# Patient Record
Sex: Female | Born: 1960 | Race: Asian | Hispanic: Refuse to answer | Marital: Married | Smoking: Never smoker
Health system: Southern US, Community
[De-identification: ages and names within clinical notes are randomized; demographics above are authoritative.]

## PROBLEM LIST (undated history)

## (undated) DIAGNOSIS — I10 Essential (primary) hypertension: Secondary | ICD-10-CM

## (undated) HISTORY — DX: Essential (primary) hypertension: I10

## (undated) HISTORY — PX: TUBAL LIGATION: SHX77

---

## 2005-03-27 ENCOUNTER — Encounter: Admission: RE | Admit: 2005-03-27 | Discharge: 2005-03-27 | Payer: Self-pay | Admitting: Cardiology

## 2005-08-09 ENCOUNTER — Encounter: Admission: RE | Admit: 2005-08-09 | Discharge: 2005-08-09 | Payer: Self-pay | Admitting: Obstetrics and Gynecology

## 2005-08-28 ENCOUNTER — Encounter: Admission: RE | Admit: 2005-08-28 | Discharge: 2005-08-28 | Payer: Self-pay | Admitting: Obstetrics and Gynecology

## 2005-09-06 ENCOUNTER — Other Ambulatory Visit: Admission: RE | Admit: 2005-09-06 | Discharge: 2005-09-06 | Payer: Self-pay | Admitting: Obstetrics and Gynecology

## 2007-08-26 ENCOUNTER — Emergency Department (HOSPITAL_COMMUNITY): Admission: EM | Admit: 2007-08-26 | Discharge: 2007-08-26 | Payer: Self-pay | Admitting: Emergency Medicine

## 2011-03-27 LAB — URINALYSIS, ROUTINE W REFLEX MICROSCOPIC
Glucose, UA: NEGATIVE
Ketones, ur: NEGATIVE
Protein, ur: NEGATIVE
Urobilinogen, UA: 0.2

## 2014-09-22 ENCOUNTER — Other Ambulatory Visit: Payer: Self-pay

## 2014-09-22 DIAGNOSIS — Z1231 Encounter for screening mammogram for malignant neoplasm of breast: Secondary | ICD-10-CM

## 2014-09-25 ENCOUNTER — Ambulatory Visit
Admission: RE | Admit: 2014-09-25 | Discharge: 2014-09-25 | Disposition: A | Discharge status: Home / Self Care | Payer: Self-pay | Source: Ambulatory Visit

## 2014-09-25 DIAGNOSIS — Z1231 Encounter for screening mammogram for malignant neoplasm of breast: Secondary | ICD-10-CM

## 2016-06-30 ENCOUNTER — Other Ambulatory Visit: Payer: Self-pay | Admitting: Cardiology

## 2016-06-30 ENCOUNTER — Ambulatory Visit
Admission: RE | Admit: 2016-06-30 | Discharge: 2016-06-30 | Disposition: A | Discharge status: Home / Self Care | Payer: BLUE CROSS/BLUE SHIELD | Source: Ambulatory Visit | Attending: Cardiology | Admitting: Cardiology

## 2016-06-30 DIAGNOSIS — M25572 Pain in left ankle and joints of left foot: Secondary | ICD-10-CM

## 2019-10-10 ENCOUNTER — Other Ambulatory Visit: Payer: Self-pay

## 2019-10-13 ENCOUNTER — Other Ambulatory Visit (HOSPITAL_COMMUNITY)
Admission: RE | Admit: 2019-10-13 | Discharge: 2019-10-13 | Disposition: A | Discharge status: Home / Self Care | Payer: BC Managed Care – PPO | Source: Ambulatory Visit | Attending: Obstetrics & Gynecology | Admitting: Obstetrics & Gynecology

## 2019-10-13 ENCOUNTER — Other Ambulatory Visit: Payer: Self-pay

## 2019-10-13 ENCOUNTER — Ambulatory Visit (INDEPENDENT_AMBULATORY_CARE_PROVIDER_SITE_OTHER): Payer: BC Managed Care – PPO | Admitting: Obstetrics & Gynecology

## 2019-10-13 ENCOUNTER — Encounter: Payer: Self-pay | Admitting: Obstetrics & Gynecology

## 2019-10-13 VITALS — BP 134/78 | HR 70 | Temp 97.5°F | Resp 16 | Ht 62.25 in | Wt 173.0 lb

## 2019-10-13 DIAGNOSIS — Z205 Contact with and (suspected) exposure to viral hepatitis: Secondary | ICD-10-CM

## 2019-10-13 DIAGNOSIS — E2839 Other primary ovarian failure: Secondary | ICD-10-CM

## 2019-10-13 DIAGNOSIS — Z23 Encounter for immunization: Secondary | ICD-10-CM

## 2019-10-13 DIAGNOSIS — Z124 Encounter for screening for malignant neoplasm of cervix: Secondary | ICD-10-CM

## 2019-10-13 DIAGNOSIS — I10 Essential (primary) hypertension: Secondary | ICD-10-CM

## 2019-10-13 DIAGNOSIS — Z01419 Encounter for gynecological examination (general) (routine) without abnormal findings: Secondary | ICD-10-CM

## 2019-10-13 DIAGNOSIS — Z0000 Encounter for general adult medical examination without abnormal findings: Secondary | ICD-10-CM

## 2019-10-13 NOTE — Progress Notes (Signed)
59 y.o. [id number] Married [place] female here for annual exam.  Had lapse of care from gyn standpoint.  She is friends with Dr. Collene Mares.  She has her first grandchild due in May.    Denies vaginal bleeding.  Never on HRT.    No LMP recorded. Patient is postmenopausal.          Sexually active: Yes.    The current method of family planning is tubal ligation.    Exercising: Yes.    walking Smoker:  no  Health Maintenance: Pap:  24 yrs ago History of abnormal Pap:  no MMG:  09-25-14 category b density birads 1:neg Colonoscopy: no, declines BMD:   none TDaP:  2005  Pneumonia vaccine(s):  Not done Shingrix:   D/w pt.  She is planning on this in the fall Hep C testing: obtained today Screening Labs: obtained today   reports that she has never smoked. She has never used smokeless tobacco. She reports previous alcohol use. She reports that she does not use drugs.  Past Medical History:  Diagnosis Date  . Hypertension     Past Surgical History:  Procedure Laterality Date  . CESAREAN SECTION     times 3  . TUBAL LIGATION      Current Outpatient Medications  Medication Sig Dispense Refill  . Cholecalciferol (VITAMIN D3) 1.25 MG (50000 UT) CAPS once a week.    Marland Kitchen LOSARTAN POTASSIUM PO      No current facility-administered medications for this visit.    Family History  Problem Relation Age of Onset  . Hypertension Mother   . Hypertension Father   . Thyroid disease Sister   . Leukemia Nephew     Review of Systems  Constitutional: Negative.   HENT: Negative.   Eyes: Negative.   Respiratory: Negative.   Cardiovascular: Negative.   Gastrointestinal: Negative.   Endocrine: Negative.   Genitourinary: Negative.   Musculoskeletal: Negative.   Skin: Negative.   Allergic/Immunologic: Negative.   Neurological: Negative.   Psychiatric/Behavioral: Negative.     Exam:   BP 134/78   Pulse 70   Temp (!) 97.5 F (36.4 C) (Skin)   Resp 16   Ht 5' 2.25" (1.581 m)   Wt 173 lb (78.5  kg)   BMI 31.39 kg/m    Height: 5' 2.25" (158.1 cm)  Ht Readings from Last 3 Encounters:  10/13/19 5' 2.25" (1.581 m)    General appearance: alert, cooperative and appears stated age Head: Normocephalic, without obvious abnormality, atraumatic Neck: no adenopathy, supple, symmetrical, trachea midline and thyroid normal to inspection and palpation Lungs: clear to auscultation bilaterally Breasts: normal appearance, no masses or tenderness Heart: regular rate and rhythm Abdomen: soft, non-tender; bowel sounds normal; no masses,  no organomegaly Extremities: extremities normal, atraumatic, no cyanosis or edema Skin: Skin color, texture, turgor normal. No rashes or lesions Lymph nodes: Cervical, supraclavicular, and axillary nodes normal. No abnormal inguinal nodes palpated Neurologic: Grossly normal   Pelvic: External genitalia:  no lesions              Urethra:  normal appearing urethra with no masses, tenderness or lesions              Bartholins and Skenes: normal                 Vagina: normal appearing vagina with normal color and discharge, no lesions              Cervix: no lesions  Pap taken: Yes.   Bimanual Exam:  Uterus:  normal size, contour, position, consistency, mobility, non-tender              Adnexa: normal adnexa and no mass, fullness, tenderness               Rectovaginal: Confirms               Anus:  normal sphincter tone, no lesions  Chaperone, Christean Grief, CMA, was present for exam.  A:  Well Woman with normal exam PMP, no HRT Lapse of gyn care for >20 years Hypertension  P:   Mammogram guidelines reviewed.  Pt will schedule. pap smear and HR HPV obtained today Hep C antibody HbA1C, CBC, CMP, lipids, TSH and Vit D obtained today Order for BMD placed today Tdap will be updated today D/w pt Shingrix vaccination today.  She is going to wait unitl the fall for this. return annually or prn

## 2019-10-13 NOTE — Patient Instructions (Signed)
[facility] appt:  [phone]

## 2019-10-14 LAB — COMPREHENSIVE METABOLIC PANEL
ALT: 18 IU/L (ref 0–32)
AST: 23 IU/L (ref 0–40)
Albumin/Globulin Ratio: 1.6 (ref 1.2–2.2)
Albumin: 4.5 g/dL (ref 3.8–4.9)
Alkaline Phosphatase: 80 IU/L (ref 39–117)
BUN/Creatinine Ratio: 13 (ref 12–28)
BUN: 9 mg/dL (ref 8–27)
Bilirubin Total: 0.3 mg/dL (ref 0.0–1.2)
CO2: 24 mmol/L (ref 20–29)
Calcium: 9.4 mg/dL (ref 8.7–10.3)
Chloride: 102 mmol/L (ref 96–106)
Creatinine, Ser: 0.68 mg/dL (ref 0.57–1.00)
GFR calc Af Amer: 110 mL/min/{1.73_m2} (ref >59)
GFR calc non Af Amer: 95 mL/min/{1.73_m2} (ref >59)
Globulin, Total: 2.8 g/dL (ref 1.5–4.5)
Glucose: 90 mg/dL (ref 65–99)
Potassium: 4.4 mmol/L (ref 3.5–5.2)
Sodium: 142 mmol/L (ref 134–144)
Total Protein: 7.3 g/dL (ref 6.0–8.5)

## 2019-10-14 LAB — HEPATITIS C ANTIBODY: Hep C Virus Ab: <0.1 s/co ratio (ref 0.0–0.9)

## 2019-10-14 LAB — VITAMIN D 25 HYDROXY (VIT D DEFICIENCY, FRACTURES): Vit D, 25-Hydroxy: 41.6 ng/mL (ref 30.0–100.0)

## 2019-10-14 LAB — CYTOLOGY - PAP
Adequacy: ABSENT
Comment: NEGATIVE
Diagnosis: NEGATIVE
High risk HPV: NEGATIVE

## 2019-10-14 LAB — CBC
Hematocrit: 40.6 % (ref 34.0–46.6)
Hemoglobin: 13.4 g/dL (ref 11.1–15.9)
MCH: 31.5 pg (ref 26.6–33.0)
MCHC: 33 g/dL (ref 31.5–35.7)
MCV: 96 fL (ref 79–97)
Platelets: 261 10*3/uL (ref 150–450)
RBC: 4.25 x10E6/uL (ref 3.77–5.28)
RDW: 13.3 % (ref 11.7–15.4)
WBC: 6.4 10*3/uL (ref 3.4–10.8)

## 2019-10-14 LAB — TSH: TSH: 5.94 u[IU]/mL — ABNORMAL HIGH (ref 0.450–4.500)

## 2019-10-14 LAB — LIPID PANEL
Chol/HDL Ratio: 3.6 ratio (ref 0.0–4.4)
Cholesterol, Total: 190 mg/dL (ref 100–199)
HDL: 53 mg/dL (ref >39)
LDL Chol Calc (NIH): 114 mg/dL — ABNORMAL HIGH (ref 0–99)
Triglycerides: 132 mg/dL (ref 0–149)
VLDL Cholesterol Cal: 23 mg/dL (ref 5–40)

## 2019-10-14 LAB — HEMOGLOBIN A1C
Est. average glucose Bld gHb Est-mCnc: 128 mg/dL
Hgb A1c MFr Bld: 6.1 % — ABNORMAL HIGH (ref 4.8–5.6)

## 2019-10-17 LAB — T4, FREE: Free T4: 1.02 ng/dL (ref 0.82–1.77)

## 2019-10-17 LAB — SPECIMEN STATUS REPORT

## 2019-10-21 ENCOUNTER — Telehealth: Payer: Self-pay

## 2019-10-21 NOTE — Telephone Encounter (Signed)
-----   Message from Jerene Bears, MD sent at 10/19/2019  5:44 AM EDT ----- I called pt with all results of lab work.  I did add on a free T4.  She has seen these results as well.  It was normal.  We discussed rechecking a TSH, free T4 and possibly a HbA1C in 3 months.  Please see if she wants to schedule these.  She is going to establish care with PCP as well.  Does not need help with this.  I have not placed any future orders at this time.  Thanks.

## 2019-10-21 NOTE — Telephone Encounter (Signed)
Left message to call Jaymeson Mengel at [phone]. 

## 2019-10-22 ENCOUNTER — Other Ambulatory Visit: Payer: Self-pay | Admitting: Obstetrics & Gynecology

## 2019-10-22 DIAGNOSIS — E2839 Other primary ovarian failure: Secondary | ICD-10-CM

## 2019-10-22 DIAGNOSIS — Z1231 Encounter for screening mammogram for malignant neoplasm of breast: Secondary | ICD-10-CM

## 2019-10-23 ENCOUNTER — Ambulatory Visit
Admission: RE | Admit: 2019-10-23 | Discharge: 2019-10-23 | Disposition: A | Discharge status: Home / Self Care | Payer: BC Managed Care – PPO | Source: Ambulatory Visit | Attending: Obstetrics & Gynecology | Admitting: Obstetrics & Gynecology

## 2019-10-23 ENCOUNTER — Other Ambulatory Visit: Payer: Self-pay | Admitting: Obstetrics & Gynecology

## 2019-10-23 ENCOUNTER — Other Ambulatory Visit: Payer: Self-pay

## 2019-10-23 DIAGNOSIS — R7989 Other specified abnormal findings of blood chemistry: Secondary | ICD-10-CM

## 2019-10-23 DIAGNOSIS — Z1231 Encounter for screening mammogram for malignant neoplasm of breast: Secondary | ICD-10-CM | POA: Diagnosis not present

## 2019-10-23 DIAGNOSIS — R7303 Prediabetes: Secondary | ICD-10-CM

## 2019-10-23 NOTE — Telephone Encounter (Signed)
Patient notified of results. See lab. Lab appt scheduled for .

## 2020-01-16 ENCOUNTER — Other Ambulatory Visit: Payer: BC Managed Care – PPO

## 2020-01-16 ENCOUNTER — Other Ambulatory Visit (INDEPENDENT_AMBULATORY_CARE_PROVIDER_SITE_OTHER): Payer: BC Managed Care – PPO

## 2020-01-16 ENCOUNTER — Other Ambulatory Visit: Payer: Self-pay

## 2020-01-16 ENCOUNTER — Ambulatory Visit: Admission: RE | Admit: 2020-01-16 | Payer: BC Managed Care – PPO | Source: Ambulatory Visit

## 2020-01-16 DIAGNOSIS — R7989 Other specified abnormal findings of blood chemistry: Secondary | ICD-10-CM | POA: Diagnosis not present

## 2020-01-16 DIAGNOSIS — R7303 Prediabetes: Secondary | ICD-10-CM

## 2020-01-17 LAB — TSH: TSH: 7.23 u[IU]/mL — ABNORMAL HIGH (ref 0.450–4.500)

## 2020-01-17 LAB — T4, FREE: Free T4: 1.11 ng/dL (ref 0.82–1.77)

## 2020-01-17 LAB — HEMOGLOBIN A1C
Est. average glucose Bld gHb Est-mCnc: 126 mg/dL
Hgb A1c MFr Bld: 6 % — ABNORMAL HIGH (ref 4.8–5.6)

## 2020-01-23 ENCOUNTER — Other Ambulatory Visit: Payer: Self-pay | Admitting: Obstetrics & Gynecology

## 2020-01-23 DIAGNOSIS — E2839 Other primary ovarian failure: Secondary | ICD-10-CM

## 2020-01-26 ENCOUNTER — Other Ambulatory Visit: Payer: BC Managed Care – PPO

## 2020-02-10 ENCOUNTER — Telehealth: Payer: Self-pay

## 2020-02-10 DIAGNOSIS — R7989 Other specified abnormal findings of blood chemistry: Secondary | ICD-10-CM

## 2020-02-10 NOTE — Telephone Encounter (Signed)
Pt called back to say she wanted to be referred to Dr Talmage Nap. Pt advised referral placed. Pt thankful for referral.  Addendum closed.  Cc: Rosa for update

## 2020-02-10 NOTE — Addendum Note (Signed)
Addended by: Isabell Jarvis on: 02/10/2020 02:14 PM   Modules accepted: Orders

## 2020-02-10 NOTE — Telephone Encounter (Signed)
Spoke with pt. Pt agreeable to referral to Dr Elvera Lennox. Referral placed. Pt aware of call from [facility] for referral.   Please call on pt's mobile number only.  Cc: Rosa Routing to Dr Hyacinth Meeker for update  Encounter closed.    Leda Min, RN  01/22/2020 3:22 PM EDT Back to Top    Spoke with patient, advised as seen below per Dr. Hyacinth Meeker. Patient would like to discuss with her spouse, she will return call to the office to advise where she would like endocrinology referral to be placed. Patient verbalizes understanding and is agreeable.   Routing to Dr. Gloris Ham.    Jerene Bears, MD  01/22/2020 7:46 AM EDT     Please call pt. Her TSH was a little higher and her free T4 was still normal. This is subclinical hypothyroidism. It is sometimes treated. Also, she still has prediabetes. I would recommend an endocrinology consultation with Dr. Elvera Lennox to discuss both. Ok to proceed if pt is comfortable with this. Thanks.

## 2020-02-16 ENCOUNTER — Ambulatory Visit
Admission: RE | Admit: 2020-02-16 | Discharge: 2020-02-16 | Disposition: A | Discharge status: Home / Self Care | Payer: BC Managed Care – PPO | Source: Ambulatory Visit | Attending: Obstetrics & Gynecology | Admitting: Obstetrics & Gynecology

## 2020-02-16 ENCOUNTER — Other Ambulatory Visit: Payer: Self-pay

## 2020-02-16 DIAGNOSIS — E2839 Other primary ovarian failure: Secondary | ICD-10-CM

## 2020-02-16 DIAGNOSIS — Z780 Asymptomatic menopausal state: Secondary | ICD-10-CM | POA: Diagnosis not present

## 2020-02-16 DIAGNOSIS — M85852 Other specified disorders of bone density and structure, left thigh: Secondary | ICD-10-CM | POA: Diagnosis not present

## 2020-03-22 DIAGNOSIS — R7301 Impaired fasting glucose: Secondary | ICD-10-CM | POA: Diagnosis not present

## 2020-03-22 DIAGNOSIS — I10 Essential (primary) hypertension: Secondary | ICD-10-CM | POA: Diagnosis not present

## 2020-03-22 DIAGNOSIS — E039 Hypothyroidism, unspecified: Secondary | ICD-10-CM | POA: Diagnosis not present

## 2020-05-03 DIAGNOSIS — R7301 Impaired fasting glucose: Secondary | ICD-10-CM | POA: Diagnosis not present

## 2020-05-03 DIAGNOSIS — E039 Hypothyroidism, unspecified: Secondary | ICD-10-CM | POA: Diagnosis not present

## 2020-11-26 ENCOUNTER — Other Ambulatory Visit: Payer: Self-pay

## 2020-11-26 ENCOUNTER — Emergency Department (HOSPITAL_COMMUNITY): Payer: BC Managed Care – PPO

## 2020-11-26 ENCOUNTER — Emergency Department (HOSPITAL_COMMUNITY)
Admission: EM | Admit: 2020-11-26 | Discharge: 2020-11-26 | Disposition: A | Discharge status: Home / Self Care | Payer: BC Managed Care – PPO | Attending: Emergency Medicine | Admitting: Emergency Medicine

## 2020-11-26 DIAGNOSIS — N200 Calculus of kidney: Secondary | ICD-10-CM | POA: Diagnosis not present

## 2020-11-26 DIAGNOSIS — Z79899 Other long term (current) drug therapy: Secondary | ICD-10-CM | POA: Insufficient documentation

## 2020-11-26 DIAGNOSIS — I700 Atherosclerosis of aorta: Secondary | ICD-10-CM | POA: Diagnosis not present

## 2020-11-26 DIAGNOSIS — K429 Umbilical hernia without obstruction or gangrene: Secondary | ICD-10-CM | POA: Diagnosis not present

## 2020-11-26 DIAGNOSIS — R188 Other ascites: Secondary | ICD-10-CM | POA: Diagnosis not present

## 2020-11-26 DIAGNOSIS — I10 Essential (primary) hypertension: Secondary | ICD-10-CM | POA: Diagnosis not present

## 2020-11-26 DIAGNOSIS — N132 Hydronephrosis with renal and ureteral calculous obstruction: Secondary | ICD-10-CM | POA: Diagnosis not present

## 2020-11-26 DIAGNOSIS — R109 Unspecified abdominal pain: Secondary | ICD-10-CM | POA: Diagnosis not present

## 2020-11-26 LAB — CBC WITH DIFFERENTIAL/PLATELET
Abs Immature Granulocytes: 0.04 10*3/uL (ref 0.00–0.07)
Basophils Absolute: 0.1 10*3/uL (ref 0.0–0.1)
Basophils Relative: 1 %
Eosinophils Absolute: 0.2 10*3/uL (ref 0.0–0.5)
Eosinophils Relative: 1 %
HCT: 43 % (ref 36.0–46.0)
Hemoglobin: 14.3 g/dL (ref 12.0–15.0)
Immature Granulocytes: 0 %
Lymphocytes Relative: 19 %
Lymphs Abs: 2.2 10*3/uL (ref 0.7–4.0)
MCH: 32.6 pg (ref 26.0–34.0)
MCHC: 33.3 g/dL (ref 30.0–36.0)
MCV: 97.9 fL (ref 80.0–100.0)
Monocytes Absolute: 0.5 10*3/uL (ref 0.1–1.0)
Monocytes Relative: 5 %
Neutro Abs: 8.8 10*3/uL — ABNORMAL HIGH (ref 1.7–7.7)
Neutrophils Relative %: 74 %
Platelets: 264 10*3/uL (ref 150–400)
RBC: 4.39 MIL/uL (ref 3.87–5.11)
RDW: 13.2 % (ref 11.5–15.5)
WBC: 11.8 10*3/uL — ABNORMAL HIGH (ref 4.0–10.5)
nRBC: 0 % (ref 0.0–0.2)

## 2020-11-26 LAB — URINALYSIS, ROUTINE W REFLEX MICROSCOPIC
Bilirubin Urine: NEGATIVE
Glucose, UA: NEGATIVE mg/dL
Hgb urine dipstick: NEGATIVE
Ketones, ur: NEGATIVE mg/dL
Leukocytes,Ua: NEGATIVE
Nitrite: NEGATIVE
Protein, ur: NEGATIVE mg/dL
Specific Gravity, Urine: 1.006 (ref 1.005–1.030)
pH: 8 (ref 5.0–8.0)

## 2020-11-26 LAB — COMPREHENSIVE METABOLIC PANEL
ALT: 19 U/L (ref 0–44)
AST: 21 U/L (ref 15–41)
Albumin: 4.9 g/dL (ref 3.5–5.0)
Alkaline Phosphatase: 78 U/L (ref 38–126)
Anion gap: 10 (ref 5–15)
BUN: 12 mg/dL (ref 8–23)
CO2: 28 mmol/L (ref 22–32)
Calcium: 9.8 mg/dL (ref 8.9–10.3)
Chloride: 103 mmol/L (ref 98–111)
Creatinine, Ser: 0.97 mg/dL (ref 0.44–1.00)
GFR, Estimated: >60 mL/min (ref >60)
Glucose, Bld: 119 mg/dL — ABNORMAL HIGH (ref 70–99)
Potassium: 3.5 mmol/L (ref 3.5–5.1)
Sodium: 141 mmol/L (ref 135–145)
Total Bilirubin: 0.4 mg/dL (ref 0.3–1.2)
Total Protein: 9 g/dL — ABNORMAL HIGH (ref 6.5–8.1)

## 2020-11-26 MED ORDER — ONDANSETRON HCL 4 MG/2ML IJ SOLN
4.0000 mg | Freq: Once | INTRAMUSCULAR | Status: AC
  Administered 2020-11-26: 4 mg via INTRAVENOUS
  Filled 2020-11-26: qty 2

## 2020-11-26 MED ORDER — HYDROCODONE-ACETAMINOPHEN 5-325 MG PO TABS
1.0000 | ORAL_TABLET | Freq: Once | ORAL | Status: AC
  Administered 2020-11-26: 1 via ORAL
  Filled 2020-11-26: qty 1

## 2020-11-26 MED ORDER — HYDROCODONE-ACETAMINOPHEN 5-325 MG PO TABS: 1.0000 | ORAL_TABLET | Freq: Four times a day (QID) | ORAL | 0 refills | Status: DC | PRN

## 2020-11-26 MED ORDER — SODIUM CHLORIDE 0.9 % IV SOLN: INTRAVENOUS | Status: DC

## 2020-11-26 MED ORDER — KETOROLAC TROMETHAMINE 15 MG/ML IJ SOLN
15.0000 mg | Freq: Once | INTRAMUSCULAR | Status: AC
  Administered 2020-11-26: 15 mg via INTRAVENOUS
  Filled 2020-11-26: qty 1

## 2020-11-26 MED ORDER — MORPHINE SULFATE (PF) 4 MG/ML IV SOLN
4.0000 mg | Freq: Once | INTRAVENOUS | Status: AC
Start: 2020-11-26 — End: 2020-11-26
  Administered 2020-11-26: 4 mg via INTRAVENOUS
  Filled 2020-11-26: qty 1

## 2020-11-26 NOTE — ED Provider Notes (Signed)
[facility]-EMERGENCY DEPT Provider Note   CSN: [id number] Arrival date & time: 11/26/20  1510     History Chief Complaint  Patient presents with  . Flank Pain    Ashlee Oconnor is a 60 y.o. female.  The history is provided by the patient.  Flank Pain This is a new problem. The current episode started 3 to 5 hours ago. The problem occurs constantly. The problem has not changed since onset.Associated symptoms comments: Left flank pain that started a little bit last night but was resolved this morning.  2 to 3 hours ago the pain started suddenly and has been severe 10 out of 10.  There is not significant radiation of pain.  Nothing seems to make the pain any better.  1 episode of vomiting related to pain.  Bowel movements have been normal.  The pain in the left side gets a little bit worse with urinating but no dysuria, frequency or urgency.  No fever, cough, shortness of breath or chest pain. Last kidney stone was approximately 10 years ago.. Nothing aggravates the symptoms. Nothing relieves the symptoms. She has tried nothing for the symptoms. The treatment provided no relief.       Past Medical History:  Diagnosis Date  . Hypertension     Patient Active Problem List   Diagnosis Date Noted  . Hypertension 10/13/2019    Past Surgical History:  Procedure Laterality Date  . CESAREAN SECTION     times 3  . TUBAL LIGATION       OB History    Gravida  3   Para  3   Term  3   Preterm      AB      Living  3     SAB      IAB      Ectopic      Multiple      Live Births              Family History  Problem Relation Age of Onset  . Hypertension Mother   . Hypertension Father   . Thyroid disease Sister   . Leukemia Nephew     Social History   Tobacco Use  . Smoking status: Never Smoker  . Smokeless tobacco: Never Used  Substance Use Topics  . Alcohol use: Not Currently  . Drug use: Never    Home Medications Prior to  Admission medications   Medication Sig Start Date End Date Taking? Authorizing Provider  Cholecalciferol (VITAMIN D3) 1.25 MG (50000 UT) CAPS once a week. 10/10/19   [provider]  LOSARTAN POTASSIUM PO  08/24/19   [provider]    Allergies    Patient has no allergy information on record.  Review of Systems   Review of Systems  Genitourinary: Positive for flank pain.  All other systems reviewed and are negative.   Physical Exam Updated Vital Signs BP (!) 160/79 (BP Location: Left Arm)   Pulse 85   Temp 97.9 F (36.6 C) (Oral)   Resp 18   Ht 5\' 3"  (1.6 m)   Wt 76.2 kg   SpO2 100%   BMI 29.76 kg/m   Physical Exam Vitals and nursing note reviewed.  Constitutional:      General: She is not in acute distress.    Appearance: She is well-developed.     Comments: Appears uncomfortable  HENT:     Head: Normocephalic and atraumatic.     Mouth/Throat:  Mouth: Mucous membranes are moist.  Eyes:     Pupils: Pupils are equal, round, and reactive to light.  Cardiovascular:     Rate and Rhythm: Normal rate and regular rhythm.     Heart sounds: Normal heart sounds. No murmur heard. No friction rub.  Pulmonary:     Effort: Pulmonary effort is normal.     Breath sounds: Normal breath sounds. No wheezing or rales.  Abdominal:     General: Bowel sounds are normal. There is no distension.     Palpations: Abdomen is soft.     Tenderness: There is no abdominal tenderness. There is left CVA tenderness. There is no guarding or rebound.     Comments: Minimal left flank pain  Musculoskeletal:        General: No tenderness. Normal range of motion.     Right lower leg: No edema.     Left lower leg: No edema.     Comments: No edema  Skin:    General: Skin is warm and dry.     Findings: No rash.  Neurological:     Mental Status: She is alert and oriented to person, place, and time. Mental status is at baseline.     Cranial Nerves: No cranial nerve deficit.   Psychiatric:        Mood and Affect: Mood normal.        Behavior: Behavior normal.        Thought Content: Thought content normal.     ED Results / Procedures / Treatments   Labs (all labs ordered are listed, but only abnormal results are displayed) Labs Reviewed  CBC WITH DIFFERENTIAL/PLATELET - Abnormal; Notable for the following components:      Result Value   WBC 11.8 (*)    Neutro Abs 8.8 (*)    All other components within normal limits  COMPREHENSIVE METABOLIC PANEL - Abnormal; Notable for the following components:   Glucose, Bld 119 (*)    Total Protein 9.0 (*)    All other components within normal limits  URINALYSIS, ROUTINE W REFLEX MICROSCOPIC - Abnormal; Notable for the following components:   Color, Urine COLORLESS (*)    All other components within normal limits    EKG None  Radiology CT Renal Stone Study  Result Date: 11/26/2020 CLINICAL DATA:  60 year old with left flank pain. EXAM: CT ABDOMEN AND PELVIS WITHOUT CONTRAST TECHNIQUE: Multidetector CT imaging of the abdomen and pelvis was performed following the standard protocol without IV contrast. COMPARISON:  08/26/2007. FINDINGS: Lower chest: 3 mm nodular density along the left major fissure on sequence 6, image 5. Otherwise, the lung bases are clear. No pleural effusions. Hepatobiliary: Normal appearance of the liver and gallbladder. No biliary dilatation. Pancreas: Unremarkable. No pancreatic ductal dilatation or surrounding inflammatory changes. Spleen: Normal in size without focal abnormality. Adrenals/Urinary Tract: Normal adrenal glands. 3 mm stone in the right kidney lower pole without hydronephrosis. 2 mm stone near the left ureterovesical junction. Mild left hydroureteronephrosis with left perinephric edema. No additional stones within the left kidney. Small amount of fluid in the urinary bladder. Stomach/Bowel: Stomach is within normal limits. Appendix appears normal. No evidence of bowel wall thickening,  distention, or inflammatory changes. Vascular/Lymphatic: Atherosclerotic calcifications in the abdominal aorta without aneurysm. No lymph node enlargement in the abdomen or pelvis. Reproductive: Uterus and bilateral adnexa are unremarkable. Other: Negative for free fluid. Negative for free air. Tiny umbilical hernia containing fat. Musculoskeletal: No acute bone abnormality. IMPRESSION: 1. Mild left  hydroureteronephrosis due to a 2 mm stone at the left ureterovesical junction. Left perinephric edema. 2. Nonobstructive right renal calculus. 3. 3 mm nodule along the left major fissure. No follow-up needed if patient is low-risk. Non-contrast chest CT can be considered in 12 months if patient is high-risk. This recommendation follows the consensus statement: Guidelines for Management of Incidental Pulmonary Nodules Detected on CT Images: From the Fleischner Society 2017; Radiology 2017; 284:228-243. Electronically Signed   By: Richarda Overlie M.D.   On: 11/26/2020 17:22    Procedures Procedures   Medications Ordered in ED Medications  0.9 %  sodium chloride infusion (has no administration in time range)  ondansetron (ZOFRAN) injection 4 mg (has no administration in time range)  morphine 4 MG/ML injection 4 mg (has no administration in time range)    ED Course  I have reviewed the triage vital signs and the nursing notes.  Pertinent labs & imaging results that were available during my care of the patient were reviewed by me and considered in my medical decision making (see chart for details).    MDM Rules/Calculators/A&P                          Pt with symptoms consistent with kidney stone.  Denies infectious sx, or GI symptoms.  Low concern for diverticulitis and low suspicion for AAA.  No hx suggestive of GU source (discharge) and otherwise pt is healthy.  Will treat pain and ensure no infection with UA, CBC, CMP and will get stone study to further eval.  4:38 PM Pt still having pain.  CBC and CMP  wnl.  Will give toradol.  CT pending.  5:37 PM UA is within normal limits.  CT shows a 2 mm UVJ stone on the left with mild hydronephrosis.  On repeat evaluation patient reports the pain is better but still present.  She would like to go home.  She was given a Vicodin and given a prescription.  MDM Number of Diagnoses or Management Options   Amount and/or Complexity of Data Reviewed Clinical lab tests: ordered and reviewed Tests in the radiology section of CPT: ordered and reviewed Independent visualization of images, tracings, or specimens: yes     Final Clinical Impression(s) / ED Diagnoses Final diagnoses:  Kidney stone on left side    Rx / DC Orders ED Discharge Orders         Ordered    HYDROcodone-acetaminophen (NORCO/VICODIN) 5-325 MG tablet  Every 6 hours PRN        11/26/20 1740           Gwyneth Sprout, MD 11/26/20 1740

## 2020-11-26 NOTE — ED Notes (Signed)
Pt ambulated to bathroom with minimal assistance.

## 2020-11-26 NOTE — ED Triage Notes (Signed)
Pt came from work via POV. Pt states pain in left flank that feels like a kidney stone. Pt states pain is intermittently sever and began approximately 2 hours ago suddenly. Pt states her most recent kidney stone was 10 years ago.

## 2020-11-26 NOTE — Discharge Instructions (Signed)
You have a 61mm stone on the left side today and a 62mm stone in the right kidney.  Drink plenty of fluid.  Return if you have severe pain, vomiting or fever.

## 2020-12-28 IMAGING — MG DIGITAL SCREENING BILAT W/ TOMO W/ CAD
8 series · 8 of 24 positions shown · non-contrast
Comparison: Previous exam(s).

CLINICAL DATA: Screening.

EXAM:
DIGITAL SCREENING BILATERAL MAMMOGRAM WITH TOMO AND CAD

[R MLO synth-2D]
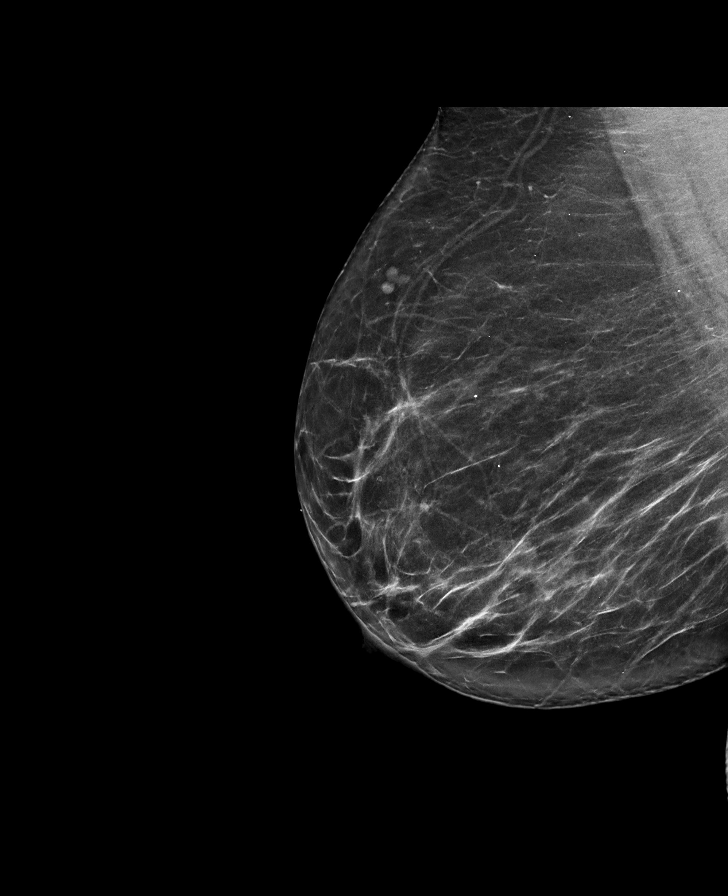

[L CC synth-2D]
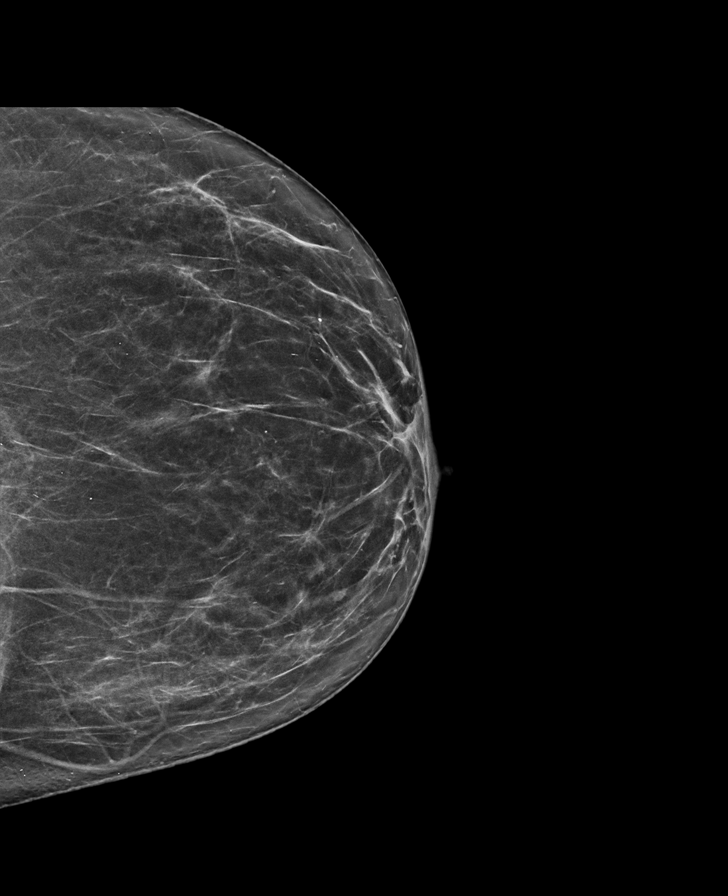

[R CC synth-2D]
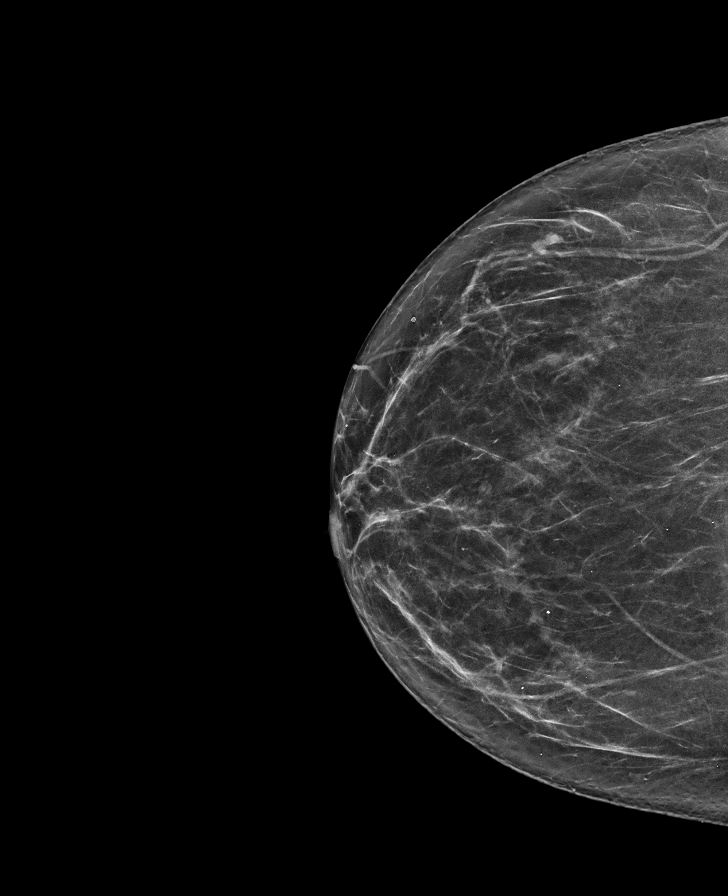

[L MLO synth-2D]
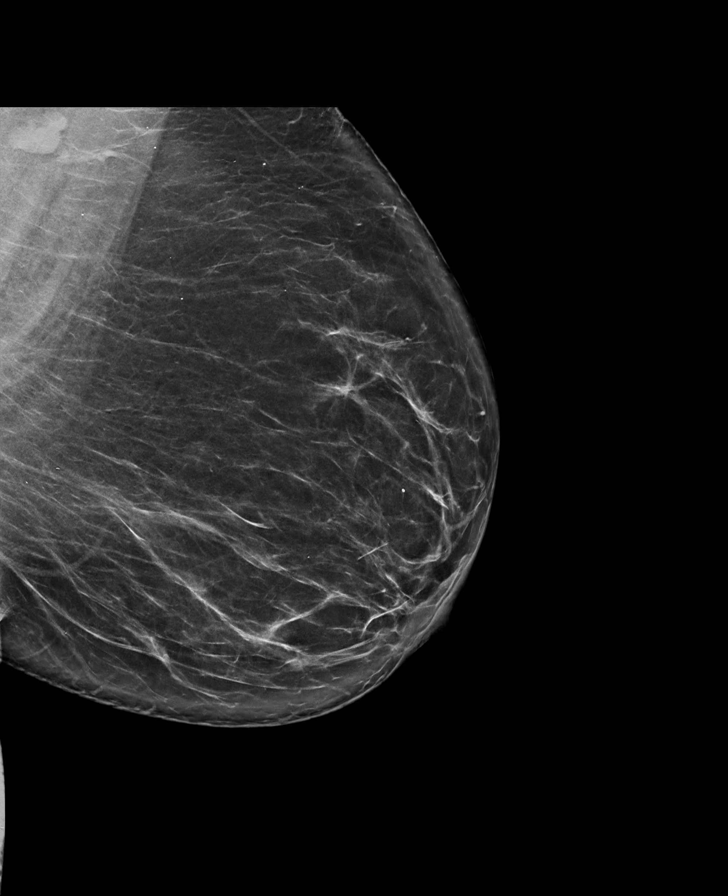

[L MLO tomo · tomo slice 45/88.0]
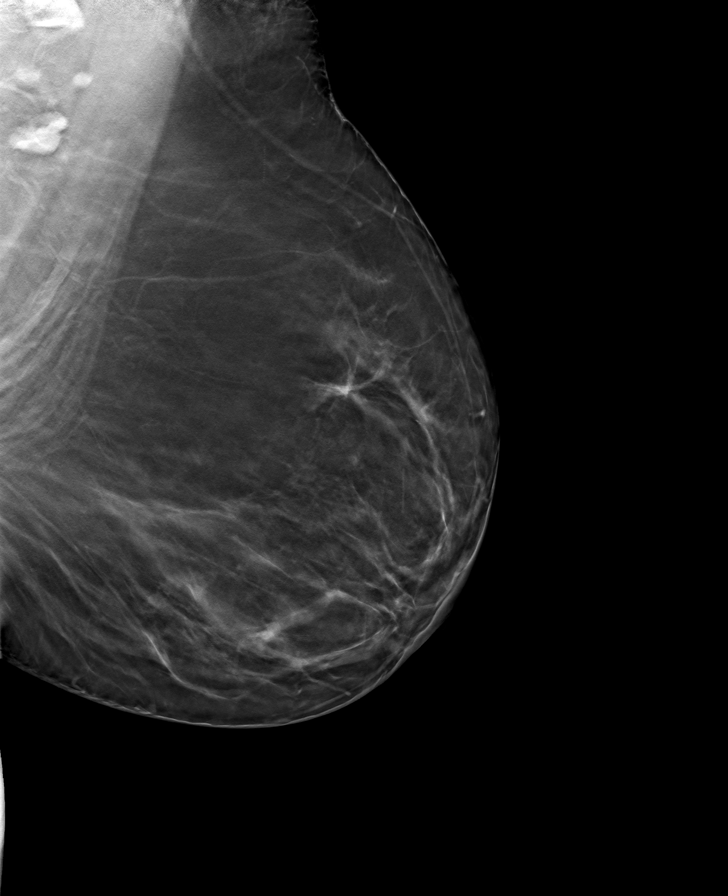

[L CC tomo · tomo slice 37/72.0]
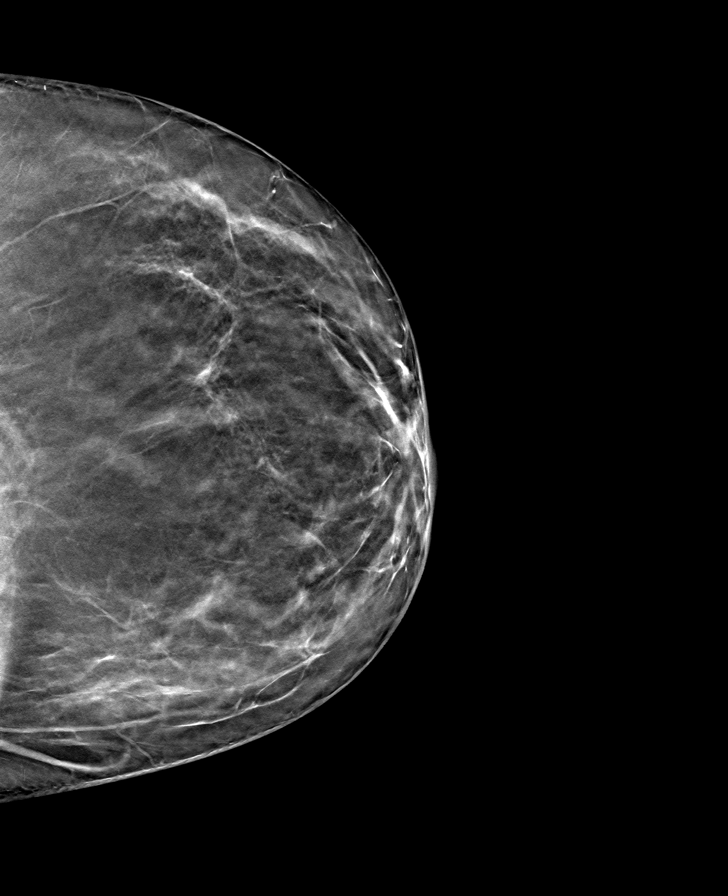

[R CC tomo · tomo slice 35/70.0]
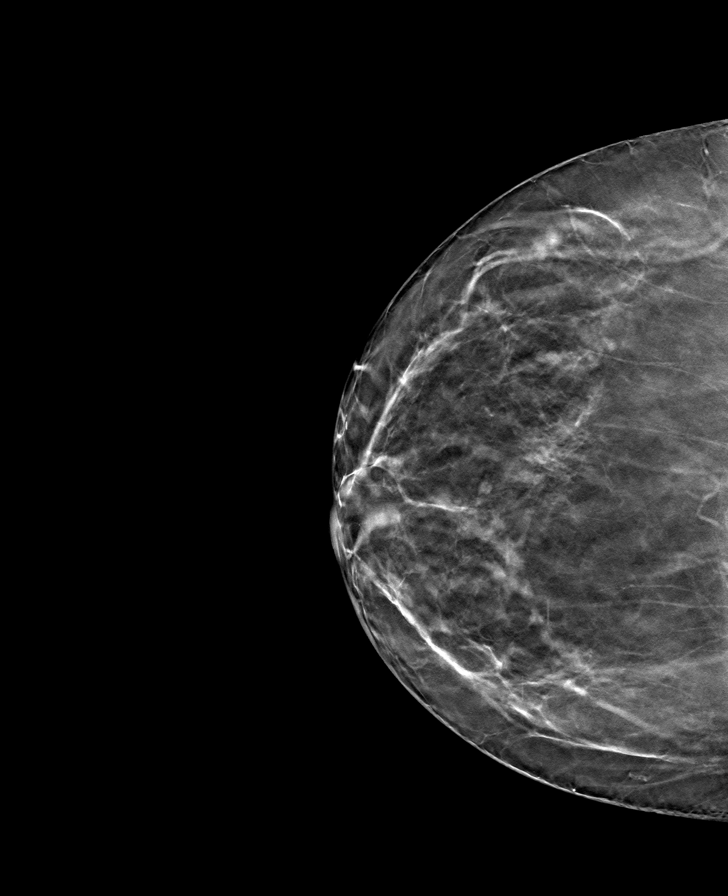

[R MLO tomo · tomo slice 41/81.0]
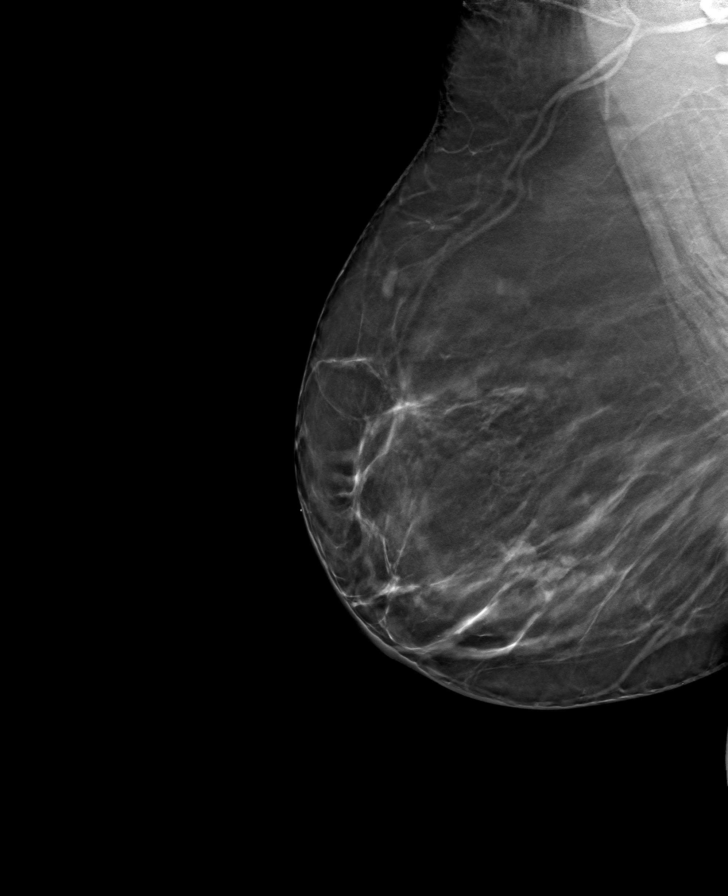

[8 of 24 positions shown; findings below may reference images not displayed]

ACR Breast Density Category b: There are scattered areas of
fibroglandular density.
FINDINGS: There are no findings suspicious for malignancy. Images were
processed with CAD.
IMPRESSION: No mammographic evidence of malignancy. A result letter of this
screening mammogram will be mailed directly to the patient.

RECOMMENDATION:
Screening mammogram in one year. (Code:[id number]-775)

BI-RADS CATEGORY  1: Negative.

## 2021-06-14 ENCOUNTER — Observation Stay (HOSPITAL_COMMUNITY)
Admission: EM | Admit: 2021-06-14 | Discharge: 2021-06-14 | Disposition: A | Discharge status: Home / Self Care | Payer: BC Managed Care – PPO | Attending: Interventional Cardiology | Admitting: Interventional Cardiology

## 2021-06-14 ENCOUNTER — Other Ambulatory Visit (HOSPITAL_COMMUNITY): Payer: BC Managed Care – PPO

## 2021-06-14 ENCOUNTER — Encounter (HOSPITAL_COMMUNITY): Payer: Self-pay | Admitting: Emergency Medicine

## 2021-06-14 ENCOUNTER — Emergency Department (HOSPITAL_COMMUNITY): Payer: BC Managed Care – PPO

## 2021-06-14 ENCOUNTER — Other Ambulatory Visit: Payer: Self-pay

## 2021-06-14 ENCOUNTER — Other Ambulatory Visit (HOSPITAL_COMMUNITY): Payer: Self-pay

## 2021-06-14 ENCOUNTER — Encounter (HOSPITAL_COMMUNITY)
Admission: EM | Disposition: A | Discharge status: Home / Self Care | Payer: Self-pay | Source: Home / Self Care | Attending: Emergency Medicine

## 2021-06-14 DIAGNOSIS — I200 Unstable angina: Secondary | ICD-10-CM | POA: Diagnosis present

## 2021-06-14 DIAGNOSIS — Z79899 Other long term (current) drug therapy: Secondary | ICD-10-CM | POA: Diagnosis not present

## 2021-06-14 DIAGNOSIS — I25110 Atherosclerotic heart disease of native coronary artery with unstable angina pectoris: Secondary | ICD-10-CM | POA: Diagnosis not present

## 2021-06-14 DIAGNOSIS — Z20822 Contact with and (suspected) exposure to covid-19: Secondary | ICD-10-CM | POA: Diagnosis not present

## 2021-06-14 DIAGNOSIS — I10 Essential (primary) hypertension: Secondary | ICD-10-CM | POA: Insufficient documentation

## 2021-06-14 DIAGNOSIS — E785 Hyperlipidemia, unspecified: Secondary | ICD-10-CM | POA: Diagnosis not present

## 2021-06-14 DIAGNOSIS — R079 Chest pain, unspecified: Secondary | ICD-10-CM

## 2021-06-14 DIAGNOSIS — Z955 Presence of coronary angioplasty implant and graft: Secondary | ICD-10-CM

## 2021-06-14 DIAGNOSIS — R0789 Other chest pain: Secondary | ICD-10-CM | POA: Diagnosis not present

## 2021-06-14 DIAGNOSIS — E039 Hypothyroidism, unspecified: Secondary | ICD-10-CM | POA: Diagnosis not present

## 2021-06-14 HISTORY — PX: CORONARY STENT INTERVENTION: CATH118234

## 2021-06-14 HISTORY — PX: INTRAVASCULAR ULTRASOUND/IVUS: CATH118244

## 2021-06-14 HISTORY — PX: LEFT HEART CATH AND CORONARY ANGIOGRAPHY: CATH118249

## 2021-06-14 LAB — COMPREHENSIVE METABOLIC PANEL
ALT: 19 U/L (ref 0–44)
AST: 19 U/L (ref 15–41)
Albumin: 3.8 g/dL (ref 3.5–5.0)
Alkaline Phosphatase: 77 U/L (ref 38–126)
Anion gap: 7 (ref 5–15)
BUN: 10 mg/dL (ref 8–23)
CO2: 26 mmol/L (ref 22–32)
Calcium: 9.2 mg/dL (ref 8.9–10.3)
Chloride: 105 mmol/L (ref 98–111)
Creatinine, Ser: 0.75 mg/dL (ref 0.44–1.00)
GFR, Estimated: >60 mL/min (ref >60)
Glucose, Bld: 128 mg/dL — ABNORMAL HIGH (ref 70–99)
Potassium: 4.1 mmol/L (ref 3.5–5.1)
Sodium: 138 mmol/L (ref 135–145)
Total Bilirubin: 0.7 mg/dL (ref 0.3–1.2)
Total Protein: 7.2 g/dL (ref 6.5–8.1)

## 2021-06-14 LAB — CBC WITH DIFFERENTIAL/PLATELET
Abs Immature Granulocytes: 0.03 10*3/uL (ref 0.00–0.07)
Basophils Absolute: 0.1 10*3/uL (ref 0.0–0.1)
Basophils Relative: 1 %
Eosinophils Absolute: 0.3 10*3/uL (ref 0.0–0.5)
Eosinophils Relative: 5 %
HCT: 42.2 % (ref 36.0–46.0)
Hemoglobin: 13.6 g/dL (ref 12.0–15.0)
Immature Granulocytes: 0 %
Lymphocytes Relative: 26 %
Lymphs Abs: 1.8 10*3/uL (ref 0.7–4.0)
MCH: 31.8 pg (ref 26.0–34.0)
MCHC: 32.2 g/dL (ref 30.0–36.0)
MCV: 98.6 fL (ref 80.0–100.0)
Monocytes Absolute: 0.4 10*3/uL (ref 0.1–1.0)
Monocytes Relative: 6 %
Neutro Abs: 4.2 10*3/uL (ref 1.7–7.7)
Neutrophils Relative %: 62 %
Platelets: 244 10*3/uL (ref 150–400)
RBC: 4.28 MIL/uL (ref 3.87–5.11)
RDW: 13.3 % (ref 11.5–15.5)
WBC: 6.7 10*3/uL (ref 4.0–10.5)
nRBC: 0 % (ref 0.0–0.2)

## 2021-06-14 LAB — TROPONIN I (HIGH SENSITIVITY)
Troponin I (High Sensitivity): 12 ng/L (ref <18)
Troponin I (High Sensitivity): 13 ng/L (ref <18)

## 2021-06-14 LAB — LIPASE, BLOOD: Lipase: 30 U/L (ref 11–51)

## 2021-06-14 LAB — RESP PANEL BY RT-PCR (FLU A&B, COVID) ARPGX2
Influenza A by PCR: NEGATIVE
Influenza B by PCR: NEGATIVE
SARS Coronavirus 2 by RT PCR: NEGATIVE

## 2021-06-14 LAB — POCT ACTIVATED CLOTTING TIME: Activated Clotting Time: 353 seconds

## 2021-06-14 LAB — BRAIN NATRIURETIC PEPTIDE: B Natriuretic Peptide: 82.4 pg/mL (ref 0.0–100.0)

## 2021-06-14 SURGERY — LEFT HEART CATH AND CORONARY ANGIOGRAPHY
Anesthesia: LOCAL

## 2021-06-14 MED ORDER — NITROGLYCERIN 1 MG/10 ML FOR IR/CATH LAB
INTRA_ARTERIAL | Status: AC
  Filled 2021-06-14: qty 10

## 2021-06-14 MED ORDER — NITROGLYCERIN 0.4 MG SL SUBL
0.4000 mg | SUBLINGUAL_TABLET | SUBLINGUAL | 2 refills | Status: AC | PRN
  Filled 2021-06-14: qty 25, 7d supply, fill #0

## 2021-06-14 MED ORDER — HYDRALAZINE HCL 20 MG/ML IJ SOLN: 10.0000 mg | INTRAMUSCULAR | Status: DC | PRN

## 2021-06-14 MED ORDER — VERAPAMIL HCL 2.5 MG/ML IV SOLN
INTRAVENOUS | Status: DC | PRN
  Administered 2021-06-14: 10 mL via INTRA_ARTERIAL

## 2021-06-14 MED ORDER — VERAPAMIL HCL 2.5 MG/ML IV SOLN
INTRAVENOUS | Status: AC
  Filled 2021-06-14: qty 2

## 2021-06-14 MED ORDER — LABETALOL HCL 5 MG/ML IV SOLN: 10.0000 mg | INTRAVENOUS | Status: DC | PRN

## 2021-06-14 MED ORDER — NITROGLYCERIN 1 MG/10 ML FOR IR/CATH LAB
INTRA_ARTERIAL | Status: DC | PRN
  Administered 2021-06-14: 400 ug via INTRA_ARTERIAL
  Administered 2021-06-14: 200 ug via INTRA_ARTERIAL

## 2021-06-14 MED ORDER — TICAGRELOR 90 MG PO TABS
90.0000 mg | ORAL_TABLET | Freq: Two times a day (BID) | ORAL | 2 refills | Status: DC
  Filled 2021-06-14: qty 60, 30d supply, fill #0

## 2021-06-14 MED ORDER — SODIUM CHLORIDE 0.9% FLUSH: 3.0000 mL | INTRAVENOUS | Status: DC | PRN

## 2021-06-14 MED ORDER — ACETAMINOPHEN 325 MG PO TABS: 650.0000 mg | ORAL_TABLET | ORAL | Status: DC | PRN

## 2021-06-14 MED ORDER — ASPIRIN 81 MG PO CHEW: 81.0000 mg | CHEWABLE_TABLET | ORAL | Status: DC

## 2021-06-14 MED ORDER — SODIUM CHLORIDE 0.9% FLUSH: 3.0000 mL | Freq: Two times a day (BID) | INTRAVENOUS | Status: DC

## 2021-06-14 MED ORDER — HEPARIN (PORCINE) IN NACL 1000-0.9 UT/500ML-% IV SOLN
INTRAVENOUS | Status: DC | PRN
  Administered 2021-06-14 (×2): 500 mL

## 2021-06-14 MED ORDER — HEPARIN (PORCINE) IN NACL 1000-0.9 UT/500ML-% IV SOLN
INTRAVENOUS | Status: AC
  Filled 2021-06-14: qty 1000

## 2021-06-14 MED ORDER — IOHEXOL 350 MG/ML SOLN
INTRAVENOUS | Status: DC | PRN
  Administered 2021-06-14: 100 mL via INTRA_ARTERIAL

## 2021-06-14 MED ORDER — HEPARIN SODIUM (PORCINE) 1000 UNIT/ML IJ SOLN
INTRAMUSCULAR | Status: AC
  Filled 2021-06-14: qty 10

## 2021-06-14 MED ORDER — TICAGRELOR 90 MG PO TABS: 90.0000 mg | ORAL_TABLET | Freq: Two times a day (BID) | ORAL | Status: DC

## 2021-06-14 MED ORDER — FENTANYL CITRATE (PF) 100 MCG/2ML IJ SOLN
INTRAMUSCULAR | Status: DC | PRN
  Administered 2021-06-14 (×3): 25 ug via INTRAVENOUS

## 2021-06-14 MED ORDER — MIDAZOLAM HCL 2 MG/2ML IJ SOLN
INTRAMUSCULAR | Status: AC
  Filled 2021-06-14: qty 2

## 2021-06-14 MED ORDER — SODIUM CHLORIDE 0.9 % WEIGHT BASED INFUSION: 3.0000 mL/kg/h | INTRAVENOUS | Status: AC

## 2021-06-14 MED ORDER — LIDOCAINE HCL (PF) 1 % IJ SOLN
INTRAMUSCULAR | Status: DC | PRN
  Administered 2021-06-14: 2 mL via INTRADERMAL

## 2021-06-14 MED ORDER — SODIUM CHLORIDE 0.9 % IV SOLN: 250.0000 mL | INTRAVENOUS | Status: DC | PRN

## 2021-06-14 MED ORDER — FENTANYL CITRATE (PF) 100 MCG/2ML IJ SOLN
INTRAMUSCULAR | Status: AC
  Filled 2021-06-14: qty 2

## 2021-06-14 MED ORDER — HEPARIN SODIUM (PORCINE) 1000 UNIT/ML IJ SOLN
INTRAMUSCULAR | Status: DC | PRN
  Administered 2021-06-14: 4500 [IU] via INTRAVENOUS
  Administered 2021-06-14: 3500 [IU] via INTRAVENOUS

## 2021-06-14 MED ORDER — VERAPAMIL HCL 2.5 MG/ML IV SOLN
INTRAVENOUS | Status: DC | PRN
  Administered 2021-06-14: 100 ug via INTRACORONARY

## 2021-06-14 MED ORDER — ASPIRIN 81 MG PO CHEW: 81.0000 mg | CHEWABLE_TABLET | Freq: Every day | ORAL | Status: DC

## 2021-06-14 MED ORDER — LIDOCAINE HCL (PF) 1 % IJ SOLN
INTRAMUSCULAR | Status: AC
  Filled 2021-06-14: qty 30

## 2021-06-14 MED ORDER — ROSUVASTATIN CALCIUM 40 MG PO TABS
40.0000 mg | ORAL_TABLET | Freq: Every day | ORAL | 1 refills | Status: DC
  Filled 2021-06-14: qty 90, 90d supply, fill #0

## 2021-06-14 MED ORDER — ASPIRIN 81 MG PO CHEW
81.0000 mg | CHEWABLE_TABLET | Freq: Every day | ORAL | 1 refills | Status: AC
  Filled 2021-06-14: qty 90, 90d supply, fill #0

## 2021-06-14 MED ORDER — NITROGLYCERIN 2 % TD OINT
1.0000 [in_us] | TOPICAL_OINTMENT | Freq: Four times a day (QID) | TRANSDERMAL | Status: DC
  Filled 2021-06-14: qty 1

## 2021-06-14 MED ORDER — MIDAZOLAM HCL 2 MG/2ML IJ SOLN
INTRAMUSCULAR | Status: DC | PRN
  Administered 2021-06-14: 2 mg via INTRAVENOUS
  Administered 2021-06-14 (×2): 1 mg via INTRAVENOUS

## 2021-06-14 MED ORDER — SODIUM CHLORIDE 0.9 % WEIGHT BASED INFUSION: 1.0000 mL/kg/h | INTRAVENOUS | Status: DC

## 2021-06-14 MED ORDER — SODIUM CHLORIDE 0.9 % IV SOLN: INTRAVENOUS | Status: AC

## 2021-06-14 MED ORDER — NITROGLYCERIN IN D5W 200-5 MCG/ML-% IV SOLN
0.0000 ug/min | INTRAVENOUS | Status: DC
  Administered 2021-06-14: 5 ug/min via INTRAVENOUS
  Filled 2021-06-14: qty 250

## 2021-06-14 MED ORDER — ONDANSETRON HCL 4 MG/2ML IJ SOLN: 4.0000 mg | Freq: Four times a day (QID) | INTRAMUSCULAR | Status: DC | PRN

## 2021-06-14 SURGICAL SUPPLY — 21 items
BALLN EMERGE MR 2.75X12 (BALLOONS) ×4
BALLN SAPPHIRE ~~LOC~~ 3.75X8 (BALLOONS) ×2 IMPLANT
BALLOON EMERGE MR 2.75X12 (BALLOONS) IMPLANT
CATH 5FR JL3.5 JR4 ANG PIG MP (CATHETERS) ×2 IMPLANT
CATH LAUNCHER 6FR EBU 3 (CATHETERS) ×2 IMPLANT
CATH OPTICROSS HD (CATHETERS) ×2 IMPLANT
DEVICE RAD COMP TR BAND LRG (VASCULAR PRODUCTS) ×2 IMPLANT
GLIDESHEATH SLEND SS 6F .021 (SHEATH) ×2 IMPLANT
GUIDEWIRE INQWIRE 1.5J.035X260 (WIRE) IMPLANT
INQWIRE 1.5J .035X260CM (WIRE) ×4
KIT ENCORE 26 ADVANTAGE (KITS) ×2 IMPLANT
KIT HEART LEFT (KITS) ×5 IMPLANT
KIT HEMO VALVE WATCHDOG (MISCELLANEOUS) ×2 IMPLANT
PACK CARDIAC CATHETERIZATION (CUSTOM PROCEDURE TRAY) ×5 IMPLANT
SHEATH PROBE COVER 6X72 (BAG) ×2 IMPLANT
SLED PULL BACK IVUS (MISCELLANEOUS) ×2 IMPLANT
STENT ONYX FRONTIER 3.5X15 (Permanent Stent) ×2 IMPLANT
SYR MEDRAD MARK 7 150ML (SYRINGE) ×5 IMPLANT
TRANSDUCER W/STOPCOCK (MISCELLANEOUS) ×5 IMPLANT
TUBING CIL FLEX 10 FLL-RA (TUBING) ×5 IMPLANT
WIRE ASAHI PROWATER 180CM (WIRE) ×2 IMPLANT

## 2021-06-14 NOTE — H&P (Addendum)
The patient has been seen in conjunction with Vin Bhagat, PAC. All aspects of care have been considered and discussed. The patient has been personally interviewed, examined, and all clinical data has been reviewed.  All data reviewed including history and examination. Ongoing chest discomfort with ST-T wave abnormality on EKG raising question of ischemic heart disease/acute coronary syndrome.  Cardiac markers are not elevated. Recommend coronary angiography as soon as possible.  Have spoken with Dr. Eldridge Dace and the patient's husband Dr. Sharyn Lull.   The procedure and risks were discussed with the patient.  She is aware of the procedure and potential risks and is willing to proceed.  Dr. Sharyn Lull was present during the conversation. She does need to have 2D Doppler echo to assess right heart.  EKG demonstrates right axis deviation and diffuse T wave inversion..  Chest x-ray is unremarkable.  BUN, creatinine are normal.  LDL cholesterol is 114.  Hemoglobin A1c is 6.0 TSH is 7.23.  BNP and troponin I unremarkable.   Cardiology Admission History and Physical:   Patient ID: Ashlee Oconnor MRN: [id number]; DOB: Dec 04, 1960   Admission date: 06/14/2021  PCP:  Rinaldo Cloud, MD   [facility] Providers Cardiologist:  Requesting to follow by Dr. Eldridge Dace  Chief Complaint:  Chest pressure   Patient Profile:   Ashlee Oconnor is a 60 y.o. female with HTN and hypothyroidism who is being seen 06/14/2021 for the evaluation of Chest pressure.  History of Present Illness:   Ms. Monahan presents with one week hx of chest pressure. She reports its mostly constant and gets worse with exertion. Associated with shortness of breath and some radiation to arm. Better with rest. No fever, chills, sick contact, palpitation, dizziness, orthopnea, PND, syncope or LE edema. She reported this to his husband who is a cardiologist and bring to ER for evaluation. Before arrival she got ASA 324mg  and brillinta 180mg .  In ER she noted hypertensive at 208/102>> 163/85. Still having 5-6/10 chest pressure.   No regular exercise but takes care of 39 month old grand kid.  Non smoker.  She never had any heart issue before. No prior stress test or echocardiogram.  Father and mother both has cardiac stenting in their 60-70s.   Past Medical History:  Diagnosis Date   Hypertension    Past Surgical History:  Procedure Laterality Date   CESAREAN SECTION     times 3   TUBAL LIGATION      Medications Prior to Admission: Prior to Admission medications   Medication Sig Start Date End Date Taking? Authorizing Provider  Cholecalciferol (VITAMIN D3) 1.25 MG (50000 UT) CAPS once a week. 10/10/19   [provider]  HYDROcodone-acetaminophen (NORCO/VICODIN) 5-325 MG tablet Take 1 tablet by mouth every 6 (six) hours as needed for severe pain. 11/26/20   12/10/19, MD  LOSARTAN POTASSIUM PO  08/24/19   [provider]    Allergies:   No Known Allergies  Social History:   Social History   Socioeconomic History   Marital status: Married    Spouse name: Not on file   Number of children: Not on file   Years of education: Not on file   Highest education level: Not on file  Occupational History   Not on file  Tobacco Use   Smoking status: Never   Smokeless tobacco: Never  Substance and Sexual Activity   Alcohol use: Not Currently   Drug use: Never   Sexual activity: Yes    Partners: Male  Birth control/protection: Surgical, Post-menopausal    Comment: BTL  Other Topics Concern   Not on file  Social History Narrative   Not on file   Social Determinants of Health   Financial Resource Strain: Not on file  Food Insecurity: Not on file  Transportation Needs: Not on file  Physical Activity: Not on file  Stress: Not on file  Social Connections: Not on file  Intimate Partner Violence: Not on file    Family History:   The patient's family history includes Hypertension in her father  and mother; Leukemia in her nephew; Thyroid disease in her sister.    ROS:  Please see the history of present illness.  All other ROS reviewed and negative.     Physical Exam/Data:   Vitals:   06/14/21 0916 06/14/21 1013  BP: (!) 208/102 (!) 163/85  Pulse: (!) 102 86  Resp: 14 14  Temp: 98.4 F (36.9 C)   TempSrc: Oral   SpO2: 100% 99%   No intake or output data in the 24 hours ending 06/14/21 1106 Last 3 Weights 11/26/2020 10/13/2019  Weight (lbs) 168 lb 173 lb  Weight (kg) 76.204 kg 78.472 kg     There is no height or weight on file to calculate BMI.  General:  Well nourished, well developed, in no acute distress HEENT: normal Neck: no JVD Vascular: No carotid bruits; Distal pulses 2+ bilaterally   Cardiac:  normal S1, S2; RRR; no murmur  Lungs:  clear to auscultation bilaterally, no wheezing, rhonchi or rales  Abd: soft, nontender, no hepatomegaly  Ext: no edema Musculoskeletal:  No deformities, BUE and BLE strength normal and equal Skin: warm and dry  Neuro:  CNs 2-12 intact, no focal abnormalities noted Psych:  Normal affect   EKG:  The ECG that was done today  was personally reviewed and demonstrates sinus rhythm with ST/T changes in inferior lateral leads   Relevant CV Studies: N/A  Laboratory Data:  High Sensitivity Troponin:   Recent Labs  Lab 06/14/21 0925  TROPONINIHS 12      Chemistry Recent Labs  Lab 06/14/21 0925  NA 138  K 4.1  CL 105  CO2 26  GLUCOSE 128*  BUN 10  CREATININE 0.75  CALCIUM 9.2  GFRNONAA >60  ANIONGAP 7    Recent Labs  Lab 06/14/21 0925  PROT 7.2  ALBUMIN 3.8  AST 19  ALT 19  ALKPHOS 77  BILITOT 0.7   Hematology Recent Labs  Lab 06/14/21 0925  WBC 6.7  RBC 4.28  HGB 13.6  HCT 42.2  MCV 98.6  MCH 31.8  MCHC 32.2  RDW 13.3  PLT 244   BNP Recent Labs  Lab 06/14/21 0925  BNP 82.4    Radiology/Studies:  DG Chest 2 View  Result Date: 06/14/2021 CLINICAL DATA:  Mid chest pressure. EXAM: CHEST - 2  VIEW COMPARISON:  None. FINDINGS: Normal mediastinum and cardiac silhouette. Normal pulmonary vasculature. No evidence of effusion, infiltrate, or pneumothorax. No acute bony abnormality. IMPRESSION: No acute cardiopulmonary process. Electronically Signed   By: Genevive Bi M.D.   On: 06/14/2021 09:52     Assessment and Plan:   Chest pressure -Patient with 1 week history of chest pressure associated with shortness of breath and occasional radiation.  Worse with exertion.  Better with rest. -She received aspirin 81 mg and Brilinta 180 mg this morning prior to arrival from her husband who is a cardiologist -Hypertensive on arrival (could be differential) -Still having 5-6/10  chest pressure -Troponin negative -BNP normal -Checks x-ray without acute abnormality -EKG with inferior lateral ST/T wave inversion (no prior EKG to compare) -Symptoms concerning for unstable angina but could be due to elevated blood pressure - Start nitro drip. Cath later today. Start Statin.  - Get Echo - Check TSH  2. HTN - BP elevated on arrival>> improved - start nitro drip - Hold losartan for cath.    Risk Assessment/Risk Scores:   TIMI Risk Score for Unstable Angina or Non-ST Elevation MI:   The patient's TIMI risk score is 2, which indicates a 8% risk of all cause mortality, new or recurrent myocardial infarction or need for urgent revascularization in the next 14 days.{  Severity of Illness: The appropriate patient status for this patient is OBSERVATION. Observation status is judged to be reasonable and necessary in order to provide the required intensity of service to ensure the patient's safety. The patient's presenting symptoms, physical exam findings, and initial radiographic and laboratory data in the context of their medical condition is felt to place them at decreased risk for further clinical deterioration. Furthermore, it is anticipated that the patient will be medically stable for discharge  from the hospital within 2 midnights of admission.    For questions or updates, please contact [facility] Please consult [email or web address] for contact info under     Lorelei Pont, PA  06/14/2021 11:06 AM

## 2021-06-14 NOTE — ED Provider Notes (Signed)
Emergency Medicine Provider Triage Evaluation Note  Ashlee Oconnor , a 60 y.o. female  was evaluated in triage.  Pt complains of chest pain for about one week. It has been constant. Describes as pressure. 6/10 rated. Mid chest. No radiation. Has associated dyspnea, constant dull headache. BP has also been high. She has been on Losartan 50 mg for about two years and has not had many BP problems since then Denies change in urination, vision changes, abdominal pain, n/v, numbness, weakness, or tingling.   Review of Systems  Positive: Cp, dyspnea, headache Negative: See above  Physical Exam  BP (!) 208/102 (BP Location: Right Arm)   Pulse (!) 102   Temp 98.4 F (36.9 C) (Oral)   Resp 14   SpO2 100%  Gen:   Awake, no distress   Resp:  Normal effort. Lungs CTA bilaterally MSK:   Moves extremities without difficulty  Other:  S1S2, no murmurs. Pulses normal. No ext swelling.  Medical Decision Making  Medically screening exam initiated at 9:20 AM.  Appropriate orders placed.  Ersa Moates was informed that the remainder of the evaluation will be completed by another provider, this initial triage assessment does not replace that evaluation, and the importance of remaining in the ED until their evaluation is complete.    Claudie Leach, PA-C 06/14/21 1157    Mancel Bale, MD 06/14/21 [phone]

## 2021-06-14 NOTE — ED Provider Notes (Signed)
[facility] EMERGENCY DEPARTMENT Provider Note   CSN: [id number] Arrival date & time: 06/14/21  7867     History Chief Complaint  Patient presents with   Chest Pain    Ashlee Oconnor is a 60 y.o. female.  60 year old female with history of hypertension presents with complaint of midsternal chest pressure x10 days, constant, worse with exertion, improves with rest and associated with shortness of breath.  Denies nausea, diaphoresis.  Pain does not radiate.  Patient did take a full aspirin prior to arrival.  No significant family cardiac history, no history of PE.  Never smoker.      Past Medical History:  Diagnosis Date   Hypertension     Patient Active Problem List   Diagnosis Date Noted   Unstable angina (HCC) 06/14/2021   Hypertension 10/13/2019    Past Surgical History:  Procedure Laterality Date   CESAREAN SECTION     times 3   TUBAL LIGATION       OB History     Gravida  3   Para  3   Term  3   Preterm      AB      Living  3      SAB      IAB      Ectopic      Multiple      Live Births              Family History  Problem Relation Age of Onset   Hypertension Mother    Hypertension Father    Thyroid disease Sister    Leukemia Nephew     Social History   Tobacco Use   Smoking status: Never   Smokeless tobacco: Never  Substance Use Topics   Alcohol use: Not Currently   Drug use: Never    Home Medications Prior to Admission medications   Medication Sig Start Date End Date Taking? Authorizing Provider  levothyroxine (SYNTHROID) 25 MCG tablet Take 25 mcg by mouth daily before breakfast.   Yes [provider]  losartan (COZAAR) 50 MG tablet Take 50 mg by mouth daily.   Yes [provider]  HYDROcodone-acetaminophen (NORCO/VICODIN) 5-325 MG tablet Take 1 tablet by mouth every 6 (six) hours as needed for severe pain. Patient not taking: Reported on 06/14/2021 11/26/20   Gwyneth Sprout, MD     Allergies    Patient has no known allergies.  Review of Systems   Review of Systems  Constitutional:  Negative for diaphoresis and fever.  Respiratory:  Positive for shortness of breath.   Cardiovascular:  Positive for chest pain. Negative for leg swelling.  Gastrointestinal:  Negative for abdominal pain, nausea and vomiting.  Musculoskeletal:  Negative for arthralgias, back pain and myalgias.  Skin:  Negative for wound.  Allergic/Immunologic: Negative for immunocompromised state.  Neurological:  Negative for weakness.  Hematological:  Negative for adenopathy.  Psychiatric/Behavioral:  Negative for confusion.   All other systems reviewed and are negative.  Physical Exam Updated Vital Signs BP (!) 142/79    Pulse 68    Temp 98.4 F (36.9 C) (Oral)    Resp 12    Wt 76.2 kg    SpO2 99%    BMI 29.76 kg/m   Physical Exam Vitals and nursing note reviewed.  Constitutional:      General: She is not in acute distress.    Appearance: She is well-developed. She is not toxic-appearing.  HENT:     Head:  Normocephalic and atraumatic.  Cardiovascular:     Rate and Rhythm: Normal rate and regular rhythm.     Heart sounds: Normal heart sounds. No murmur heard. Pulmonary:     Breath sounds: Normal breath sounds.  Chest:     Chest wall: Tenderness present.  Abdominal:     Palpations: Abdomen is soft.     Tenderness: There is no abdominal tenderness.  Musculoskeletal:     Cervical back: Neck supple.     Right lower leg: No tenderness. No edema.     Left lower leg: No tenderness. No edema.  Skin:    General: Skin is warm and dry.     Findings: No erythema or rash.  Neurological:     General: No focal deficit present.     Mental Status: She is alert.  Psychiatric:        Behavior: Behavior normal.    ED Results / Procedures / Treatments   Labs (all labs ordered are listed, but only abnormal results are displayed) Labs Reviewed  COMPREHENSIVE METABOLIC PANEL - Abnormal;  Notable for the following components:      Result Value   Glucose, Bld 128 (*)    All other components within normal limits  RESP PANEL BY RT-PCR (FLU A&B, COVID) ARPGX2  CBC WITH DIFFERENTIAL/PLATELET  BRAIN NATRIURETIC PEPTIDE  LIPASE, BLOOD  TROPONIN I (HIGH SENSITIVITY)  TROPONIN I (HIGH SENSITIVITY)    EKG EKG Interpretation  Date/Time:  Tuesday June 14 2021 09:20:54 EST Ventricular Rate:  86 PR Interval:  152 QRS Duration: 70 QT Interval:  342 QTC Calculation: 409 R Axis:   115 Text Interpretation: Normal sinus rhythm Right axis deviation Low voltage QRS Nonspecific ST and T wave abnormality Abnormal ECG No old tracing to compare Confirmed by Mancel Bale [phone]) on 06/14/2021 11:13:14 AM  Radiology DG Chest 2 View  Result Date: 06/14/2021 CLINICAL DATA:  Mid chest pressure. EXAM: CHEST - 2 VIEW COMPARISON:  None. FINDINGS: Normal mediastinum and cardiac silhouette. Normal pulmonary vasculature. No evidence of effusion, infiltrate, or pneumothorax. No acute bony abnormality. IMPRESSION: No acute cardiopulmonary process. Electronically Signed   By: Genevive Bi M.D.   On: 06/14/2021 09:52    Procedures Procedures   Medications Ordered in ED Medications  nitroGLYCERIN 50 mg in dextrose 5 % 250 mL (0.2 mg/mL) infusion ( Intravenous MAR Hold 06/14/21 1224)  sodium chloride flush (NS) 0.9 % injection 3 mL ( Intravenous Automatically Held 06/22/21 2200)  aspirin chewable tablet 81 mg (has no administration in time range)  0.9% sodium chloride infusion (has no administration in time range)    Followed by  0.9% sodium chloride infusion (has no administration in time range)    ED Course  I have reviewed the triage vital signs and the nursing notes.  Pertinent labs & imaging results that were available during my care of the patient were reviewed by me and considered in my medical decision making (see chart for details).  Clinical Course as of 06/14/21 1241  Tue  Jun 14, 2021  6822 60 year old female with complaint of chest pressure, worse with exertion, improves with rest, associated with SHOB. On exam, well appearing, non toxic, in no distress. Found to have slight anterior chest wall pain, HR RRR, lungs CTA, abdomen soft and non tender, no lower extremity edema. Patient was given 324mg  ASA at home by her husband who is a cardiologist.  EKG with t-wave changes, no prior for comparison.  Initial troponin 12.  Discussed with Dr. Eldridge Dace whom Dr. Sharyn Lull has spoke with, plan is for Dr. Katrinka Blazing with cardiology to see the patient. Repeat troponin is 13.  [LM]  1241 CBC and CMP without significant findings, no notable electrolyte derangement. Lipase WNL. BNP normal at 82.4. COVID/flu negative.  CXR normal.  [LM]    Clinical Course User Index [LM] Alden Hipp   MDM Rules/Calculators/A&P                           Final Clinical Impression(s) / ED Diagnoses Final diagnoses:  Chest pain, unspecified type    Rx / DC Orders ED Discharge Orders     None        Alden Hipp 06/14/21 1242    Gerhard Munch, MD 06/15/21 1338

## 2021-06-14 NOTE — Interval H&P Note (Signed)
Cath Lab Visit (complete for each Cath Lab visit)  Clinical Evaluation Leading to the Procedure:   ACS: Yes.    Non-ACS:    Anginal Classification: CCS IV  Anti-ischemic medical therapy: Minimal Therapy (1 class of medications)  Non-Invasive Test Results: No non-invasive testing performed  Prior CABG: No previous CABG      History and Physical Interval Note:  06/14/2021 12:39 PM  Sharifah Tinner  has presented today for surgery, with the diagnosis of chest pain.  The various methods of treatment have been discussed with the patient and family. After consideration of risks, benefits and other options for treatment, the patient has consented to  Procedure(s): LEFT HEART CATH AND CORONARY ANGIOGRAPHY (N/A) as a surgical intervention.  The patient's history has been reviewed, patient examined, no change in status, stable for surgery.  I have reviewed the patient's chart and labs.  Questions were answered to the patient's satisfaction.     Lance Muss

## 2021-06-14 NOTE — Discharge Summary (Addendum)
Discharge Summary for Same Day PCI   Patient ID: Ashlee Oconnor MRN: [id number]; DOB: [birth year]  Admit date: 06/14/2021 Discharge date: 06/14/2021  Primary Care Provider: Rinaldo Cloud, MD  Primary Cardiologist: None [facility] Primary Electrophysiologist:  None   Discharge Diagnoses    Principal Problem:   Unstable angina [facility]) Active Problems:   Hypertension   Hyperlipidemia  Diagnostic Studies/Procedures    Cardiac Catheterization 06/14/2021:   A drug-eluting stent was successfully placed using a STENT ONYX FRONTIER 3.5X15, postdilated to 3.75 mm and optimized with IVUS.   1st Mrg lesion is 25% stenosed.   1st Diag lesion is 50% stenosed.   Ost LAD lesion is 20% stenosed.   Prox LAD lesion is 75% stenosed.   Post intervention, there is a 0% residual stenosis.   The left ventricular systolic function is normal.   LV end diastolic pressure is normal.   The left ventricular ejection fraction is 55-65% by visual estimate.   There is no aortic valve stenosis.   She needs aggressive secondary prevention with high intensity statin.  Dual antiplatelet therapy with Brilinta for 12 months.  Consider clopidogrel monotherapy after 12 months.  Healthy diet and regular exercise along with blood pressure control.   Plan for same-day discharge  Diagnostic Dominance: Co-dominant Intervention   _____________   History of Present Illness     Ashlee Oconnor is a 60 y.o. female with HTN and hypothyroidism who presented to the ED with chest pain and found to have a NSTEMI.   [name] presented with one week hx of chest pressure. She reports its mostly constant and gets worse with exertion. Associated with shortness of breath and some radiation to arm. Better with rest. No fever, chills, sick contact, palpitation, dizziness, orthopnea, PND, syncope or LE edema. She reported this to her husband who is a cardiologist and brought to ER for evaluation. Before arrival she got ASA 324mg   and brillinta 180mg . In ER she noted hypertensive at 208/102>> 163/85. She was still having 5-6/10 chest pressure.    No regular exercise but takes care of 28 month old grand kid.  Non smoker.  She never had any heart issue before. No prior stress test or echocardiogram.  Father and mother both has cardiac stenting in their 60-70s.   Given findings she was taken for cardiac cath.   Hospital Course     The patient underwent cardiac cath as noted above with 75% pLAD lesion treated with PCI/DES x1. Plan for DAPT with ASA/Brilinta for at least one year. Residual disease of 20% ostial LAD, 1st OM 25%, 1st Diag 50% The patient was seen by cardiac rehab while in short stay. There were no observed complications post cath. Radial cath site was re-evaluated prior to discharge and found to be stable without any complications. Instructions/precautions regarding cath site care were given prior to discharge.  Ashlee Oconnor was seen by Dr. 04-11-1974 and determined stable for discharge home. Follow up with our office has been arranged. Medications are listed below. Pertinent changes include plavix, Crestor, ASA, nitro.  _____________  Cath/PCI Registry Performance & Quality Measures: Aspirin prescribed? - Yes ADP Receptor Inhibitor (Plavix/Clopidogrel, Brilinta/Ticagrelor or Effient/Prasugrel) prescribed (includes medically managed patients)? - Yes High Intensity Statin (Lipitor 40-80mg  or Crestor 20-40mg ) prescribed? - Yes For EF <40%, was ACEI/ARB prescribed? - Not Applicable (EF >/= 40%) For EF <40%, Aldosterone Antagonist (Spironolactone or Eplerenone) prescribed? - Not Applicable (EF >/= 40%) Cardiac Rehab Phase II ordered (Included Medically managed Patients)? - Yes  _____________   Discharge Vitals Blood pressure 122/70, pulse 69, temperature 98.4 F (36.9 C), temperature source Oral, resp. rate 12 weight 76.2 kg, SpO2 99 %.  Filed Weights   06/14/21 1100  Weight: 76.2 kg    Last Labs &  Radiologic Studies    CBC Recent Labs    06/14/21 0925  WBC 6.7  NEUTROABS 4.2  HGB 13.6  HCT 42.2  MCV 98.6  PLT 244   Basic Metabolic Panel Recent Labs    52/77/82 0925  NA 138  K 4.1  CL 105  CO2 26  GLUCOSE 128*  BUN 10  CREATININE 0.75  CALCIUM 9.2   Liver Function Tests Recent Labs    06/14/21 0925  AST 19  ALT 19  ALKPHOS 77  BILITOT 0.7  PROT 7.2  ALBUMIN 3.8   Recent Labs    06/14/21 0925  LIPASE 30   High Sensitivity Troponin:   Recent Labs  Lab 06/14/21 0925 06/14/21 1121  TROPONINIHS 12 13    BNP Invalid input(s): POCBNP D-Dimer No results for input(s): DDIMER in the last 72 hours. Hemoglobin A1C No results for input(s): HGBA1C in the last 72 hours. Fasting Lipid Panel No results for input(s): CHOL, HDL, LDLCALC, TRIG, CHOLHDL, LDLDIRECT in the last 72 hours. Thyroid Function Tests No results for input(s): TSH, T4TOTAL, T3FREE, THYROIDAB in the last 72 hours.  Invalid input(s): FREET3 _____________  DG Chest 2 View  Result Date: 06/14/2021 CLINICAL DATA:  Mid chest pressure. EXAM: CHEST - 2 VIEW COMPARISON:  None. FINDINGS: Normal mediastinum and cardiac silhouette. Normal pulmonary vasculature. No evidence of effusion, infiltrate, or pneumothorax. No acute bony abnormality. IMPRESSION: No acute cardiopulmonary process. Electronically Signed   By: Genevive Bi M.D.   On: 06/14/2021 09:52   CARDIAC CATHETERIZATION  Addendum Date: 06/14/2021     A drug-eluting stent was successfully placed using a STENT ONYX FRONTIER 3.5X15, postdilated to 3.75 mm and optimized with IVUS.   1st Mrg lesion is 25% stenosed.   1st Diag lesion is 50% stenosed.   Ost LAD lesion is 20% stenosed.   Prox LAD lesion is 75% stenosed.   Post intervention, there is a 0% residual stenosis.   The left ventricular systolic function is normal.   LV end diastolic pressure is normal.   The left ventricular ejection fraction is 55-65% by visual estimate.   There is no  aortic valve stenosis. She needs aggressive secondary prevention with high intensity statin.  Dual antiplatelet therapy with Brilinta for 12 months.  Consider clopidogrel monotherapy after 12 months.  Healthy diet and regular exercise along with blood pressure control. Plan for same-day discharge.  Result Date: 06/14/2021   Prox LAD lesion is 75% stenosed.   A drug-eluting stent was successfully placed using a STENT ONYX FRONTIER 3.5X15, postdilated to 3.75 mm and optimized with IVUS.   Post intervention, there is a 0% residual stenosis.   1st Mrg lesion is 25% stenosed.   1st Diag lesion is 50% stenosed.   Ost LAD lesion is 20% stenosed.   The left ventricular systolic function is normal.   LV end diastolic pressure is normal.   The left ventricular ejection fraction is 55-65% by visual estimate.   There is no aortic valve stenosis. She needs aggressive secondary prevention with high intensity statin.  Dual antiplatelet therapy with Brilinta for 12 months.  Consider clopidogrel monotherapy after 12 months.  Healthy diet and regular exercise along with blood pressure control. Plan for same-day  discharge.    Disposition   Pt is being discharged home today in good condition.  Follow-up Plans & Appointments     Follow-up Information     Corky Crafts, MD Follow up on 06/29/2021.   Specialties: Cardiology, Radiology, Interventional Cardiology Why: at 2:20pm for your follow up appt Contact information: [address] [place] [address] [phone]                Discharge Instructions     Amb Referral to Cardiac Rehabilitation   Complete by: As directed    Diagnosis:  Coronary Stents PTCA     After initial evaluation and assessments completed: Virtual Based Care may be provided alone or in conjunction with Phase 2 Cardiac Rehab based on patient barriers.: Yes        Discharge Medications   Allergies as of 06/14/2021   No Known Allergies       Medication List     STOP taking these medications    HYDROcodone-acetaminophen 5-325 MG tablet Commonly known as: NORCO/VICODIN       TAKE these medications    aspirin 81 MG chewable tablet Chew 1 tablet (81 mg total) by mouth daily.   levothyroxine 25 MCG tablet Commonly known as: SYNTHROID Take 25 mcg by mouth daily before breakfast.   losartan 50 MG tablet Commonly known as: COZAAR Take 50 mg by mouth daily.   nitroGLYCERIN 0.4 MG SL tablet Commonly known as: Nitrostat Place 1 tablet (0.4 mg total) under the tongue every 5 (five) minutes as needed.   rosuvastatin 40 MG tablet Commonly known as: Crestor Take 1 tablet (40 mg total) by mouth daily.   ticagrelor 90 MG Tabs tablet Commonly known as: BRILINTA Take 1 tablet (90 mg total) by mouth 2 (two) times daily.          Allergies No Known Allergies  Outstanding Labs/Studies   FLP/LFTs in 8 weeks   Duration of Discharge Encounter   Greater than 30 minutes including physician time.  Signed, Laverda Page, NP 06/14/2021, 4:01 PM  I have examined the patient and reviewed assessment and plan and discussed with patient.  Agree with above as stated.  Right wrist stable.  Doing well post procedure.  Stressed importance of DAPT with Brilinta.  Will need secondary prevention including high potency statin, healthy diet, BP control and regular exercise.   Lance Muss

## 2021-06-14 NOTE — ED Triage Notes (Signed)
Pt reports chest pressure x 1 week with SOB.  Not feeling herself x 10 days.  Denies nausea and vomiting.

## 2021-06-14 NOTE — Progress Notes (Signed)
Per Eldridge Dace, MD Nitro gtt can be discontinued.

## 2021-06-14 NOTE — Progress Notes (Signed)
Discussed with pt and family stent, Brilinta importance, restrictions, diet, exercise, NTG, and CRPII. Pt receptive. Will refer to [facility] CRPII. [phone] Ethelda Chick CES, ACSM 3:54 PM 06/14/2021

## 2021-06-15 ENCOUNTER — Other Ambulatory Visit (HOSPITAL_COMMUNITY): Payer: Self-pay

## 2021-06-15 ENCOUNTER — Encounter (HOSPITAL_COMMUNITY): Payer: Self-pay | Admitting: Interventional Cardiology

## 2021-06-20 ENCOUNTER — Telehealth (HOSPITAL_COMMUNITY): Payer: Self-pay

## 2021-06-20 NOTE — Telephone Encounter (Signed)
Called patient to see if she is interested in the Cardiac Rehab Program. Patient expressed interest. Explained scheduling process and went over insurance process, patient verbalized understanding. Will contact patient for scheduling once f/u has been completed. °

## 2021-06-22 MED ORDER — METOPROLOL SUCCINATE ER 25 MG PO TB24: 25.0000 mg | ORAL_TABLET | Freq: Every day | ORAL | 2 refills | Status: AC

## 2021-06-22 NOTE — Telephone Encounter (Signed)
Discussed with patient's husband, Dr. Sharyn Lull.  Patient has had some spikes in BP at home.  Post PCI, she had BPs in the 140s and HR in the 70-80 range.  Will add Toprol XL 25 mg daily given recent PCI after episode of unstable angina.

## 2021-06-29 ENCOUNTER — Other Ambulatory Visit (HOSPITAL_COMMUNITY): Payer: Self-pay

## 2021-06-29 ENCOUNTER — Telehealth (HOSPITAL_COMMUNITY): Payer: Self-pay | Admitting: Pharmacist

## 2021-06-29 ENCOUNTER — Ambulatory Visit: Payer: BC Managed Care – PPO | Admitting: Interventional Cardiology

## 2021-06-29 NOTE — Telephone Encounter (Signed)
Transitions of Care Pharmacy  ° °Call attempted for a pharmacy transitions of care follow-up. HIPAA appropriate voicemail was left with call back information provided.  ° °Call attempt #1. Will follow-up in 2-3 days.  °  °

## 2021-06-30 ENCOUNTER — Telehealth (HOSPITAL_COMMUNITY): Payer: Self-pay

## 2021-06-30 ENCOUNTER — Other Ambulatory Visit (HOSPITAL_COMMUNITY): Payer: Self-pay

## 2021-06-30 NOTE — Telephone Encounter (Signed)
°  Pharmacy Transitions of Care Follow-up Telephone Call  Date of discharge: 06/14/21  Discharge Diagnosis: Unstable angina with stent  How have you been since you were released from the hospital?  Patient doing well. Is currently in [place] visiting family. No questions about meds at this time.  Medication changes made at discharge:  - START:  Aspirin Low Dose (aspirin)  Brilinta (ticagrelor)  nitroGLYCERIN (Nitrostat)  rosuvastatin (Crestor)   - STOPPED: Hydrocodone/APAP  - CHANGED: n/a  Metoprolol ER added by Dr. Eldridge Dace on 12/19 after initial telephone follow-up.   Medication changes verified by the patient?  Yes    Medication Accessibility:  Home Pharmacy: [facility], [address] [address]   Was the patient provided with refills on discharged medications? Yes   Have all prescriptions been transferred from [facility] to home pharmacy?  Yes  Is the patient able to afford medications? Has insurance Notable copays: Brilinta [phone] with ins) Eligible patient assistance: $5 coupon avail     Medication Review:  Patient confirmed that she had no questions about medications and her husband (a cardiologist) is helping manage her meds.  TICAGRELOR (BRILINTA) Ticagrelor 90 mg BID initiated on 06/14/21.  - Educated patient on expected duration of therapy of aspirin with ticagrelor for 1 year.  - Discussed importance of taking medication around the same time every day, - Reviewed potential DDIs with patient - Advised patient of medications to avoid (NSAIDs, aspirin maintenance doses>100 mg daily) - Educated that Tylenol (acetaminophen) will be the preferred analgesic to prevent risk of bleeding  - Emphasized importance of monitoring for signs and symptoms of bleeding (abnormal bruising, prolonged bleeding, nose bleeds, bleeding from gums, discolored urine, black tarry stools)  - Educated patient to notify doctor if shortness of breath or abnormal heartbeat occur - Advised patient to alert  all providers of antiplatelet therapy prior to starting a new medication or having a procedure    Follow-up Appointments:  [facility] f/u appt confirmed?  Patient is currently in [place] so she missed her follow up appointment and is trying to schedule a new one.  If their condition worsens, is the pt aware to call PCP or go to the Emergency Dept.? yes  Final Patient Assessment:  Patient has refills at home pharmacy and is in process of scheduling follow up.

## 2021-09-12 NOTE — Progress Notes (Unsigned)
Cardiology Office Note   Date:  09/12/2021   ID:  Ashlee Oconnor, DOB December 17, 1960, MRN [id number]  PCP:  Rinaldo Cloud, MD    No chief complaint on file.    Wt Readings from Last 3 Encounters:  06/14/21 167 lb 15.9 oz (76.2 kg)  11/26/20 168 lb (76.2 kg)  10/13/19 173 lb (78.5 kg)       History of Present Illness: Ashlee Oconnor is a 61 y.o. female  with CAD.  Cath in 06/2021 for [place]: " A drug-eluting stent was successfully placed using a STENT ONYX FRONTIER 3.5X15, postdilated to 3.75 mm and optimized with IVUS.   1st Mrg lesion is 25% stenosed.   1st Diag lesion is 50% stenosed.   Ost LAD lesion is 20% stenosed.   Prox LAD lesion is 75% stenosed.   Post intervention, there is a 0% residual stenosis.   The left ventricular systolic function is normal.   LV end diastolic pressure is normal.   The left ventricular ejection fraction is 55-65% by visual estimate.   There is no aortic valve stenosis.   She needs aggressive secondary prevention with high intensity statin.  Dual antiplatelet therapy with Brilinta for 12 months.  Consider clopidogrel monotherapy after 12 months.  Healthy diet and regular exercise along with blood pressure control."    Past Medical History:  Diagnosis Date   Hypertension     Past Surgical History:  Procedure Laterality Date   CESAREAN SECTION     times 3   CORONARY STENT INTERVENTION Left 06/14/2021   Procedure: CORONARY STENT INTERVENTION;  Surgeon: Corky Crafts, MD;  Location: [facility] INVASIVE CV LAB;  Service: Cardiovascular;  Laterality: Left;   INTRAVASCULAR ULTRASOUND/IVUS N/A 06/14/2021   Procedure: Intravascular Ultrasound/IVUS;  Surgeon: Corky Crafts, MD;  Location: [facility] INVASIVE CV LAB;  Service: Cardiovascular;  Laterality: N/A;   LEFT HEART CATH AND CORONARY ANGIOGRAPHY N/A 06/14/2021   Procedure: LEFT HEART CATH AND CORONARY ANGIOGRAPHY;  Surgeon: Corky Crafts, MD;  Location: [facility] INVASIVE CV LAB;   Service: Cardiovascular;  Laterality: N/A;   TUBAL LIGATION       Current Outpatient Medications  Medication Sig Dispense Refill   aspirin 81 MG chewable tablet Chew 1 tablet (81 mg total) by mouth daily. 90 tablet 1   levothyroxine (SYNTHROID) 25 MCG tablet Take 25 mcg by mouth daily before breakfast.     losartan (COZAAR) 50 MG tablet Take 50 mg by mouth daily.     metoprolol succinate (TOPROL XL) 25 MG 24 hr tablet Take 1 tablet (25 mg total) by mouth daily. 90 tablet 2   nitroGLYCERIN (NITROSTAT) 0.4 MG SL tablet Place 1 tablet (0.4 mg total) under the tongue every 5 (five) minutes as needed. 25 tablet 2   rosuvastatin (CRESTOR) 40 MG tablet Take 1 tablet (40 mg total) by mouth daily. 90 tablet 1   ticagrelor (BRILINTA) 90 MG TABS tablet Take 1 tablet (90 mg total) by mouth 2 (two) times daily. 180 tablet 2   No current facility-administered medications for this visit.    Allergies:   Patient has no known allergies.    Social History:  The patient  reports that she has never smoked. She has never used smokeless tobacco. She reports that she does not currently use alcohol. She reports that she does not use drugs.   Family History:  The patient's ***family history includes Hypertension in her father and mother; Leukemia in her nephew; Thyroid disease in her  sister.    ROS:  Please see the history of present illness.   Otherwise, review of systems are positive for ***.   All other systems are reviewed and negative.    PHYSICAL EXAM: VS:  There were no vitals taken for this visit. , BMI There is no height or weight on file to calculate BMI. GEN: Well nourished, well developed, in no acute distress HEENT: normal Neck: no JVD, carotid bruits, or masses Cardiac: ***RRR; no murmurs, rubs, or gallops,no edema  Respiratory:  clear to auscultation bilaterally, normal work of breathing GI: soft, nontender, nondistended, + BS MS: no deformity or atrophy Skin: warm and dry, no  rash Neuro:  Strength and sensation are intact Psych: euthymic mood, full affect   EKG:   The ekg ordered today demonstrates ***   Recent Labs: 06/14/2021: ALT 19; B Natriuretic Peptide 82.4; BUN 10; Creatinine, Ser 0.75; Hemoglobin 13.6; Platelets 244; Potassium 4.1; Sodium 138   Lipid Panel    Component Value Date/Time   CHOL 190 10/13/2019 1204   TRIG 132 10/13/2019 1204   HDL 53 10/13/2019 1204   CHOLHDL 3.6 10/13/2019 1204   LDLCALC 114 (H) 10/13/2019 1204     Other studies Reviewed: Additional studies/ records that were reviewed today with results demonstrating: ***.   ASSESSMENT AND PLAN:  CAD:  HTN: Hyperlipidemia:   Current medicines are reviewed at length with the patient today.  The patient concerns regarding her medicines were addressed.  The following changes have been made:  No change***  Labs/ tests ordered today include: *** No orders of the defined types were placed in this encounter.   Recommend 150 minutes/week of aerobic exercise Low fat, low carb, high fiber diet recommended  Disposition:   FU in ***   Signed, Larae Grooms, MD  09/12/2021 7:54 AM    [facility] [address], [place], [place]  [address] Phone: [phone]; Fax: [phone]

## 2021-09-14 ENCOUNTER — Other Ambulatory Visit: Payer: Self-pay

## 2021-09-14 ENCOUNTER — Ambulatory Visit (INDEPENDENT_AMBULATORY_CARE_PROVIDER_SITE_OTHER): Payer: BC Managed Care – PPO | Admitting: Interventional Cardiology

## 2021-09-14 ENCOUNTER — Encounter: Payer: Self-pay | Admitting: Interventional Cardiology

## 2021-09-14 VITALS — BP 134/82 | HR 57 | Ht 62.0 in | Wt 161.0 lb

## 2021-09-14 DIAGNOSIS — I10 Essential (primary) hypertension: Secondary | ICD-10-CM | POA: Diagnosis not present

## 2021-09-14 DIAGNOSIS — R9431 Abnormal electrocardiogram [ECG] [EKG]: Secondary | ICD-10-CM | POA: Diagnosis not present

## 2021-09-14 DIAGNOSIS — E782 Mixed hyperlipidemia: Secondary | ICD-10-CM

## 2021-09-14 DIAGNOSIS — I25118 Atherosclerotic heart disease of native coronary artery with other forms of angina pectoris: Secondary | ICD-10-CM

## 2021-09-14 MED ORDER — CLOPIDOGREL BISULFATE 75 MG PO TABS: ORAL_TABLET | ORAL | 3 refills | Status: AC

## 2021-09-14 MED ORDER — ROSUVASTATIN CALCIUM 20 MG PO TABS: 20.0000 mg | ORAL_TABLET | Freq: Every day | ORAL | 3 refills | Status: AC

## 2021-09-14 NOTE — Patient Instructions (Signed)
Medication Instructions:  ?Your physician has recommended you make the following change in your medication: Stop Brilinta after you finish current supply.  After stopping Brilinta start Clopidogrel.  Take 4 tablets Clopidogrel the first day and then one tablet (75 mg ) by mouth daily.  ? ?*If you need a refill on your cardiac medications before your next appointment, please call your pharmacy* ? ? ?Lab Work: ?none ?If you have labs (blood work) drawn today and your tests are completely normal, you will receive your results only by: ?MyChart Message (if you have MyChart) OR ?A paper copy in the mail ?If you have any lab test that is abnormal or we need to change your treatment, we will call you to review the results. ? ? ?Testing/Procedures: ?none ? ? ?Follow-Up: ?At [facility], you and your health needs are our priority.  As part of our continuing mission to provide you with exceptional heart care, we have created designated Provider Care Teams.  These Care Teams include your primary Cardiologist (physician) and Advanced Practice Providers (APPs -  Physician Assistants and Nurse Practitioners) who all work together to provide you with the care you need, when you need it. ? ?We recommend signing up for the patient portal called "MyChart".  Sign up information is provided on this After Visit Summary.  MyChart is used to connect with patients for Virtual Visits (Telemedicine).  Patients are able to view lab/test results, encounter notes, upcoming appointments, etc.  Non-urgent messages can be sent to your provider as well.   ?To learn more about what you can do with MyChart, go to [email or web address].   ? ?Your next appointment:   ?12 month(s) ? ?The format for your next appointment:   ?In Person ? ?Provider:   ?Lance Muss, MD   ? ? ?Other Instructions ?High-Fiber Eating Plan ?Fiber, also called dietary fiber, is a type of carbohydrate. It is found foods such as fruits, vegetables, whole grains, and  beans. A high-fiber diet can have many health benefits. Your health care provider may recommend a high-fiber diet to help: ?Prevent constipation. Fiber can make your bowel movements more regular. ?Lower your cholesterol. ?Relieve the following conditions: ?Inflammation of veins in the anus (hemorrhoids). ?Inflammation of specific areas of the digestive tract (uncomplicated diverticulosis). ?A problem of the large intestine, also called the colon, that sometimes causes pain and diarrhea (irritable bowel syndrome, or IBS). ?Prevent overeating as part of a weight-loss plan. ?Prevent heart disease, type 2 diabetes, and certain cancers. ?What are tips for following this plan? ?Reading food labels ? ?Check the nutrition facts label on food products for the amount of dietary fiber. Choose foods that have 5 grams of fiber or more per serving. ?The goals for recommended daily fiber intake include: ?Men (age 60 or younger): 34-38 g. ?Men (over age 62): 28-34 g. ?Women (age 31 or younger): 25-28 g. ?Women (over age 72): 22-25 g. ?Your daily fiber goal is _____________ g. ?Shopping ?Choose whole fruits and vegetables instead of processed forms, such as apple juice or applesauce. ?Choose a wide variety of high-fiber foods such as avocados, lentils, oats, and kidney beans. ?Read the nutrition facts label of the foods you choose. Be aware of foods with added fiber. These foods often have high sugar and sodium amounts per serving. ?Cooking ?Use whole-grain flour for baking and cooking. ?Cook with brown rice instead of white rice. ?Meal planning ?Start the day with a breakfast that is high in fiber, such as a cereal that contains  5 g of fiber or more per serving. ?Eat breads and cereals that are made with whole-grain flour instead of refined flour or white flour. ?Eat brown rice, bulgur wheat, or millet instead of white rice. ?Use beans in place of meat in soups, salads, and pasta dishes. ?Be sure that half of the grains you eat  each day are whole grains. ?General information ?You can get the recommended daily intake of dietary fiber by: ?Eating a variety of fruits, vegetables, grains, nuts, and beans. ?Taking a fiber supplement if you are not able to take in enough fiber in your diet. It is better to get fiber through food than from a supplement. ?Gradually increase how much fiber you consume. If you increase your intake of dietary fiber too quickly, you may have bloating, cramping, or gas. ?Drink plenty of water to help you digest fiber. ?Choose high-fiber snacks, such as berries, raw vegetables, nuts, and popcorn. ?What foods should I eat? ?Fruits ?Berries. Pears. Apples. Oranges. Avocado. Prunes and raisins. Dried figs. ?Vegetables ?Sweet potatoes. Spinach. Kale. Artichokes. Cabbage. Broccoli. Cauliflower. Green peas. Carrots. Squash. ?Grains ?Whole-grain breads. Multigrain cereal. Oats and oatmeal. Brown rice. Barley. Bulgur wheat. Millet. Quinoa. Bran muffins. Popcorn. Rye wafer crackers. ?Meats and other proteins ?Navy beans, kidney beans, and pinto beans. Soybeans. Split peas. Lentils. Nuts and seeds. ?Dairy ?Fiber-fortified yogurt. ?Beverages ?Fiber-fortified soy milk. Fiber-fortified orange juice. ?Other foods ?Fiber bars. ?The items listed above may not be a complete list of recommended foods and beverages. Contact a dietitian for more information. ?What foods should I avoid? ?Fruits ?Fruit juice. Cooked, strained fruit. ?Vegetables ?Fried potatoes. Canned vegetables. Well-cooked vegetables. ?Grains ?White bread. Pasta made with refined flour. White rice. ?Meats and other proteins ?Fatty cuts of meat. Fried chicken or fried fish. ?Dairy ?Milk. Yogurt. Cream cheese. Sour cream. ?Fats and oils ?Butters. ?Beverages ?Soft drinks. ?Other foods ?Cakes and pastries. ?The items listed above may not be a complete list of foods and beverages to avoid. Talk with your dietitian about what choices are best for you. ?Summary ?Fiber is a type  of carbohydrate. It is found in foods such as fruits, vegetables, whole grains, and beans. ?A high-fiber diet has many benefits. It can help to prevent constipation, lower blood cholesterol, aid weight loss, and reduce your risk of heart disease, diabetes, and certain cancers. ?Increase your intake of fiber gradually. Increasing fiber too quickly may cause cramping, bloating, and gas. Drink plenty of water while you increase the amount of fiber you consume. ?The best sources of fiber include whole fruits and vegetables, whole grains, nuts, seeds, and beans. ?This information is not intended to replace advice given to you by your health care provider. Make sure you discuss any questions you have with your health care provider. ?Document Revised: 10/23/2019 Document Reviewed: 10/23/2019 ?Elsevier Patient Education ? 2022 [employer] ? ? ?

## 2021-10-28 ENCOUNTER — Telehealth (HOSPITAL_COMMUNITY): Payer: Self-pay

## 2021-10-28 NOTE — Telephone Encounter (Signed)
Called and spoke with pt in regards to CR, pt stated she is not interested at this time. B/c her event was back in December. ?  ?Closed referral ?

## 2021-10-28 NOTE — Telephone Encounter (Signed)
Pt insurance is active and benefits verified through [employer]. Co-pay $0.00, DED $10,000.00/$266.64 met, out of pocket $0.00/$0.00 met, co-insurance 30%. No pre-authorization required. [employer]port, 10/28/21 @ 10:49AM, [phone] ?

## 2024-05-10 DIAGNOSIS — J208 Acute bronchitis due to other specified organisms: Secondary | ICD-10-CM | POA: Diagnosis not present

## 2024-05-10 DIAGNOSIS — I2510 Atherosclerotic heart disease of native coronary artery without angina pectoris: Secondary | ICD-10-CM | POA: Diagnosis not present

## 2024-05-10 DIAGNOSIS — Z0001 Encounter for general adult medical examination with abnormal findings: Secondary | ICD-10-CM | POA: Diagnosis not present
